# Patient Record
Sex: Female | Born: 1985 | Race: White | Hispanic: No | Smoking: Former smoker
Health system: Southern US, Community
[De-identification: ages and names within clinical notes are randomized; demographics above are authoritative.]

## PROBLEM LIST (undated history)

## (undated) DIAGNOSIS — S82871A Displaced pilon fracture of right tibia, initial encounter for closed fracture: Secondary | ICD-10-CM

## (undated) DIAGNOSIS — I10 Essential (primary) hypertension: Secondary | ICD-10-CM

## (undated) DIAGNOSIS — S82241A Displaced spiral fracture of shaft of right tibia, initial encounter for closed fracture: Secondary | ICD-10-CM

---

## 2016-10-17 ENCOUNTER — Emergency Department (HOSPITAL_COMMUNITY)
Admission: EM | Admit: 2016-10-17 | Discharge: 2016-10-17 | Disposition: A | Discharge status: Home / Self Care | Payer: Self-pay | Attending: Emergency Medicine | Admitting: Emergency Medicine

## 2016-10-17 ENCOUNTER — Encounter (HOSPITAL_COMMUNITY): Payer: Self-pay | Admitting: Emergency Medicine

## 2016-10-17 DIAGNOSIS — F1020 Alcohol dependence, uncomplicated: Secondary | ICD-10-CM | POA: Insufficient documentation

## 2016-10-17 DIAGNOSIS — F17200 Nicotine dependence, unspecified, uncomplicated: Secondary | ICD-10-CM | POA: Insufficient documentation

## 2016-10-17 DIAGNOSIS — R000 Tachycardia, unspecified: Secondary | ICD-10-CM | POA: Insufficient documentation

## 2016-10-17 DIAGNOSIS — F1410 Cocaine abuse, uncomplicated: Secondary | ICD-10-CM | POA: Insufficient documentation

## 2016-10-17 DIAGNOSIS — F419 Anxiety disorder, unspecified: Secondary | ICD-10-CM | POA: Insufficient documentation

## 2016-10-17 DIAGNOSIS — I10 Essential (primary) hypertension: Secondary | ICD-10-CM | POA: Insufficient documentation

## 2016-10-17 HISTORY — DX: Essential (primary) hypertension: I10

## 2016-10-17 LAB — CBC WITH DIFFERENTIAL/PLATELET
BASOS ABS: 0 10*3/uL (ref 0.0–0.1)
BASOS PCT: 0 %
EOS ABS: 0 10*3/uL (ref 0.0–0.7)
Eosinophils Relative: 0 %
HEMATOCRIT: 37.8 % (ref 36.0–46.0)
HEMOGLOBIN: 12.8 g/dL (ref 12.0–15.0)
Lymphocytes Relative: 16 %
Lymphs Abs: 1.7 10*3/uL (ref 0.7–4.0)
MCH: 30.8 pg (ref 26.0–34.0)
MCHC: 33.9 g/dL (ref 30.0–36.0)
MCV: 91.1 fL (ref 78.0–100.0)
MONOS PCT: 3 %
Monocytes Absolute: 0.3 10*3/uL (ref 0.1–1.0)
NEUTROS ABS: 8.6 10*3/uL — AB (ref 1.7–7.7)
NEUTROS PCT: 81 %
Platelets: 492 10*3/uL — ABNORMAL HIGH (ref 150–400)
RBC: 4.15 MIL/uL (ref 3.87–5.11)
RDW: 11.8 % (ref 11.5–15.5)
WBC: 10.7 10*3/uL — AB (ref 4.0–10.5)

## 2016-10-17 LAB — BASIC METABOLIC PANEL
ANION GAP: 12 (ref 5–15)
BUN: 13 mg/dL (ref 6–20)
CALCIUM: 8.4 mg/dL — AB (ref 8.9–10.3)
CO2: 23 mmol/L (ref 22–32)
CREATININE: 0.73 mg/dL (ref 0.44–1.00)
Chloride: 99 mmol/L — ABNORMAL LOW (ref 101–111)
Glucose, Bld: 148 mg/dL — ABNORMAL HIGH (ref 65–99)
Potassium: 3.2 mmol/L — ABNORMAL LOW (ref 3.5–5.1)
SODIUM: 134 mmol/L — AB (ref 135–145)

## 2016-10-17 LAB — TROPONIN I

## 2016-10-17 MED ORDER — ONDANSETRON HCL 4 MG/2ML IJ SOLN
4.0000 mg | Freq: Once | INTRAMUSCULAR | Status: AC
  Administered 2016-10-17: 4 mg via INTRAVENOUS
  Filled 2016-10-17: qty 2

## 2016-10-17 MED ORDER — SODIUM CHLORIDE 0.9 % IV BOLUS (SEPSIS)
1000.0000 mL | Freq: Once | INTRAVENOUS | Status: AC
  Administered 2016-10-17: 1000 mL via INTRAVENOUS

## 2016-10-17 NOTE — Discharge Instructions (Signed)
Avoid all forms of alcohol.  Do not use cocaine.  Follow-up with a treatment center, or therapist to help improve your outlook on life, and help to avoid using alcohol and cocaine

## 2016-10-17 NOTE — ED Triage Notes (Signed)
Pt reports palpitation, st drink"alot" , used cocaine last night. Denies chest pain . Also reports nausea and emesis./ HR 147 during triage.

## 2016-10-17 NOTE — ED Provider Notes (Addendum)
[facility]-EMERGENCY DEPT Provider Note   CSN: [id number] Arrival date & time: 10/17/16  0750     History   Chief Complaint Chief Complaint  Patient presents with  . Tachycardia    HPI Toni Andrews is a 30 y.o. female.  She presents for nausea, vomiting, depression". I feel like it hit rock bottom". She has been drinking frequently, and last night used a lot of cocaine. She woke this morning vomiting. She saw a small amount of "pink stuff". She denies diarrhea, fever, chest pain, cough, weakness or dizziness. She denies suicidal ideation. She is committed to stopping drinking. There are no other known modifying factors.  HPI  Past Medical History:  Diagnosis Date  . Hypertension     There are no active problems to display for this patient.   History reviewed. No pertinent surgical history.  OB History    No data available       Home Medications    Prior to Admission medications   Medication Sig Start Date End Date Taking? Authorizing Provider  ibuprofen (ADVIL,MOTRIN) 200 MG tablet Take 200-800 mg by mouth every 6 (six) hours as needed for moderate pain.   Yes Historical Provider, MD  omeprazole (PRILOSEC OTC) 20 MG tablet Take 20 mg by mouth daily.   Yes Historical Provider, MD    Family History No family history on file.  Social History Social History  Substance Use Topics  . Smoking status: Current Every Day Smoker  . Smokeless tobacco: Never Used  . Alcohol use Yes     Allergies   Patient has no known allergies.   Review of Systems Review of Systems  All other systems reviewed and are negative.    Physical Exam Updated Vital Signs BP 168/81   Pulse 109   Temp 99.6 F (37.6 C) (Oral)   Resp 18   SpO2 99%   Physical Exam  Constitutional: She is oriented to person, place, and time. She appears well-developed and well-nourished. She appears distressed (Tearful).  HENT:  Head: Normocephalic and atraumatic.  Eyes: Conjunctivae and EOM are  normal. Pupils are equal, round, and reactive to light.  Neck: Normal range of motion and phonation normal. Neck supple.  Cardiovascular: Regular rhythm.   Tachycardic  Pulmonary/Chest: Effort normal and breath sounds normal. She exhibits no tenderness.  Abdominal: Soft. She exhibits no distension. There is no tenderness. There is no guarding.  Musculoskeletal: Normal range of motion.  Neurological: She is alert and oriented to person, place, and time. She exhibits normal muscle tone.  Skin: Skin is warm and dry.  Psychiatric: Her behavior is normal. Judgment and thought content normal.  Depressed appearance  Nursing note and vitals reviewed.    ED Treatments / Results  Labs (all labs ordered are listed, but only abnormal results are displayed) Labs Reviewed  BASIC METABOLIC PANEL - Abnormal; Notable for the following:       Result Value   Sodium 134 (*)    Potassium 3.2 (*)    Chloride 99 (*)    Glucose, Bld 148 (*)    Calcium 8.4 (*)    All other components within normal limits  CBC WITH DIFFERENTIAL/PLATELET - Abnormal; Notable for the following:    WBC 10.7 (*)    Platelets 492 (*)    Neutro Abs 8.6 (*)    All other components within normal limits  TROPONIN I    EKG  EKG Interpretation  Date/Time:  Monday October 17 2016 08:08:52 EST Ventricular Rate:  135 PR Interval:    QRS Duration: 91 QT Interval:  303 QTC Calculation: 455 R Axis:   64 Text Interpretation:  Sinus tachycardia Probable left atrial enlargement RSR' in V1 or V2, probably normal variant Borderline T wave abnormalities No old tracing to compare Confirmed by [facility]  MD, Mehki Klumpp [phone]([id number]) on 10/17/2016 12:51:02 PM       Radiology No results found.  Procedures Procedures (including critical care time)  Medications Ordered in ED Medications  sodium chloride 0.9 % bolus 1,000 mL (0 mLs Intravenous Stopped 10/17/16 0915)  ondansetron (ZOFRAN) injection 4 mg (4 mg Intravenous Given 10/17/16 0817)       Initial Impression / Assessment and Plan / ED Course  I have reviewed the triage vital signs and the nursing notes.  Pertinent labs & imaging results that were available during my care of the patient were reviewed by me and considered in my medical decision making (see chart for details).  Clinical Course as of Oct 17 1336  Mon Oct 17, 2016  [id number] She is more comfortable now, tolerating some fluids. She has been talking with friends on the phone.  [EW]    Clinical Course User Index [EW] Mancel BaleElliott Manhattan Mccuen, MD    Medications  sodium chloride 0.9 % bolus 1,000 mL (0 mLs Intravenous Stopped 10/17/16 0915)  ondansetron (ZOFRAN) injection 4 mg (4 mg Intravenous Given 10/17/16 0817)    Patient Vitals for the past 24 hrs:  BP Temp Temp src Pulse Resp SpO2  10/17/16 1300 168/81 - - 109 18 99 %  10/17/16 1233 149/75 - - 94 20 97 %  10/17/16 1203 155/80 - - 92 24 99 %  10/17/16 1200 155/80 - - 109 20 97 %  10/17/16 1130 156/90 - - 93 - 99 %  10/17/16 1100 151/97 - - 93 - 99 %  10/17/16 1030 156/91 - - 106 (!) 27 99 %  10/17/16 1000 (!) 159/101 - - 108 25 98 %  10/17/16 0930 149/92 - - 120 21 97 %  10/17/16 0900 176/92 - - (!) 131 (!) 32 98 %  10/17/16 0845 153/91 - - 120 23 96 %  10/17/16 0830 154/90 - - 120 26 95 %  10/17/16 0815 165/91 - - (!) 136 23 94 %  10/17/16 0758 182/94 99.6 F (37.6 C) Oral (!) 145 20 95 %    1:02 PM Reevaluation with update and discussion. After initial assessment and treatment, an updated evaluation reveals She is calm now. Heart rate is 92. Patient asserts that she is committed to staying away from alcohol and cocaine. She feels like she might have had a "anxiety attack" earlier today. Elaya Droege L    Final Clinical Impressions(s) / ED Diagnoses   Final diagnoses:  Tachycardia  Cocaine abuse  Anxiety  Alcoholism (HCC)    Alcoholism, substance abuse, cocaine. Possible component of anxiety/panic attack. No acute unstable psychiatric condition.  Doubt metabolic instability.  Nursing Notes Reviewed/ Care Coordinated Applicable Imaging Reviewed Interpretation of Laboratory Data incorporated into ED treatment  The patient appears reasonably screened and/or stabilized for discharge and I doubt any other medical condition or other [facility] requiring further screening, evaluation, or treatment in the ED at this time prior to discharge.  Plan: Home Medications- continue; Home Treatments- rest, avoid alcohol and cocaine; return here if the recommended treatment, does not improve the symptoms; Recommended follow up- PCP prn. OP treatment prn    New Prescriptions New Prescriptions   No medications on file  Mancel Bale, MD 10/17/16 1303    Mancel Bale, MD 10/17/16 [phone]

## 2016-12-01 ENCOUNTER — Inpatient Hospital Stay (HOSPITAL_COMMUNITY)
Admission: EM | Admit: 2016-12-01 | Discharge: 2016-12-05 | DRG: 492 | Disposition: A | Discharge status: Rehabilitation Facility | Payer: Self-pay | Attending: Orthopedic Surgery | Admitting: Orthopedic Surgery

## 2016-12-01 ENCOUNTER — Emergency Department (HOSPITAL_COMMUNITY): Payer: Self-pay

## 2016-12-01 ENCOUNTER — Encounter (HOSPITAL_COMMUNITY): Payer: Self-pay

## 2016-12-01 DIAGNOSIS — S066X0A Traumatic subarachnoid hemorrhage without loss of consciousness, initial encounter: Secondary | ICD-10-CM

## 2016-12-01 DIAGNOSIS — S82201A Unspecified fracture of shaft of right tibia, initial encounter for closed fracture: Secondary | ICD-10-CM

## 2016-12-01 DIAGNOSIS — F32A Depression, unspecified: Secondary | ICD-10-CM

## 2016-12-01 DIAGNOSIS — I10 Essential (primary) hypertension: Secondary | ICD-10-CM | POA: Diagnosis present

## 2016-12-01 DIAGNOSIS — S82871A Displaced pilon fracture of right tibia, initial encounter for closed fracture: Secondary | ICD-10-CM | POA: Diagnosis present

## 2016-12-01 DIAGNOSIS — S82831A Other fracture of upper and lower end of right fibula, initial encounter for closed fracture: Secondary | ICD-10-CM

## 2016-12-01 DIAGNOSIS — S022XXA Fracture of nasal bones, initial encounter for closed fracture: Secondary | ICD-10-CM

## 2016-12-01 DIAGNOSIS — K219 Gastro-esophageal reflux disease without esophagitis: Secondary | ICD-10-CM | POA: Diagnosis present

## 2016-12-01 DIAGNOSIS — Z79899 Other long term (current) drug therapy: Secondary | ICD-10-CM

## 2016-12-01 DIAGNOSIS — F329 Major depressive disorder, single episode, unspecified: Secondary | ICD-10-CM | POA: Diagnosis present

## 2016-12-01 DIAGNOSIS — S82241A Displaced spiral fracture of shaft of right tibia, initial encounter for closed fracture: Principal | ICD-10-CM | POA: Diagnosis present

## 2016-12-01 DIAGNOSIS — F411 Generalized anxiety disorder: Secondary | ICD-10-CM

## 2016-12-01 DIAGNOSIS — I609 Nontraumatic subarachnoid hemorrhage, unspecified: Secondary | ICD-10-CM

## 2016-12-01 DIAGNOSIS — Z720 Tobacco use: Secondary | ICD-10-CM

## 2016-12-01 DIAGNOSIS — E46 Unspecified protein-calorie malnutrition: Secondary | ICD-10-CM | POA: Diagnosis present

## 2016-12-01 DIAGNOSIS — S8251XA Displaced fracture of medial malleolus of right tibia, initial encounter for closed fracture: Secondary | ICD-10-CM | POA: Diagnosis present

## 2016-12-01 DIAGNOSIS — F17200 Nicotine dependence, unspecified, uncomplicated: Secondary | ICD-10-CM | POA: Diagnosis present

## 2016-12-01 DIAGNOSIS — D62 Acute posthemorrhagic anemia: Secondary | ICD-10-CM

## 2016-12-01 DIAGNOSIS — F1020 Alcohol dependence, uncomplicated: Secondary | ICD-10-CM | POA: Diagnosis present

## 2016-12-01 DIAGNOSIS — R262 Difficulty in walking, not elsewhere classified: Secondary | ICD-10-CM

## 2016-12-01 DIAGNOSIS — S82401A Unspecified fracture of shaft of right fibula, initial encounter for closed fracture: Secondary | ICD-10-CM

## 2016-12-01 DIAGNOSIS — S82491A Other fracture of shaft of right fibula, initial encounter for closed fracture: Secondary | ICD-10-CM | POA: Diagnosis present

## 2016-12-01 DIAGNOSIS — S066X9A Traumatic subarachnoid hemorrhage with loss of consciousness of unspecified duration, initial encounter: Secondary | ICD-10-CM

## 2016-12-01 DIAGNOSIS — E8809 Other disorders of plasma-protein metabolism, not elsewhere classified: Secondary | ICD-10-CM

## 2016-12-01 DIAGNOSIS — E876 Hypokalemia: Secondary | ICD-10-CM

## 2016-12-01 DIAGNOSIS — F1410 Cocaine abuse, uncomplicated: Secondary | ICD-10-CM

## 2016-12-01 DIAGNOSIS — T148XXA Other injury of unspecified body region, initial encounter: Secondary | ICD-10-CM

## 2016-12-01 DIAGNOSIS — R000 Tachycardia, unspecified: Secondary | ICD-10-CM | POA: Diagnosis present

## 2016-12-01 HISTORY — DX: Displaced pilon fracture of right tibia, initial encounter for closed fracture: S82.871A

## 2016-12-01 HISTORY — DX: Displaced spiral fracture of shaft of right tibia, initial encounter for closed fracture: S82.241A

## 2016-12-01 LAB — RAPID URINE DRUG SCREEN, HOSP PERFORMED
Amphetamines: NOT DETECTED
BARBITURATES: NOT DETECTED
Benzodiazepines: NOT DETECTED
Cocaine: NOT DETECTED
Opiates: POSITIVE — AB
TETRAHYDROCANNABINOL: NOT DETECTED

## 2016-12-01 LAB — CBC WITH DIFFERENTIAL/PLATELET
Basophils Absolute: 0 10*3/uL (ref 0.0–0.1)
Basophils Relative: 0 %
EOS ABS: 0 10*3/uL (ref 0.0–0.7)
EOS PCT: 0 %
HCT: 36.2 % (ref 36.0–46.0)
HEMOGLOBIN: 12 g/dL (ref 12.0–15.0)
Lymphocytes Relative: 9 %
Lymphs Abs: 1.1 10*3/uL (ref 0.7–4.0)
MCH: 29.6 pg (ref 26.0–34.0)
MCHC: 33.1 g/dL (ref 30.0–36.0)
MCV: 89.4 fL (ref 78.0–100.0)
MONO ABS: 0.6 10*3/uL (ref 0.1–1.0)
MONOS PCT: 5 %
NEUTROS PCT: 86 %
Neutro Abs: 10.9 10*3/uL — ABNORMAL HIGH (ref 1.7–7.7)
Platelets: 388 10*3/uL (ref 150–400)
RBC: 4.05 MIL/uL (ref 3.87–5.11)
RDW: 12.5 % (ref 11.5–15.5)
WBC: 12.6 10*3/uL — ABNORMAL HIGH (ref 4.0–10.5)

## 2016-12-01 LAB — COMPREHENSIVE METABOLIC PANEL
ALK PHOS: 55 U/L (ref 38–126)
ALT: 23 U/L (ref 14–54)
ANION GAP: 9 (ref 5–15)
AST: 29 U/L (ref 15–41)
Albumin: 4.3 g/dL (ref 3.5–5.0)
BUN: 15 mg/dL (ref 6–20)
CALCIUM: 8.2 mg/dL — AB (ref 8.9–10.3)
CO2: 23 mmol/L (ref 22–32)
Chloride: 108 mmol/L (ref 101–111)
Creatinine, Ser: 0.61 mg/dL (ref 0.44–1.00)
GFR calc non Af Amer: >60 mL/min (ref >60)
Glucose, Bld: 120 mg/dL — ABNORMAL HIGH (ref 65–99)
Potassium: 3.8 mmol/L (ref 3.5–5.1)
SODIUM: 140 mmol/L (ref 135–145)
TOTAL PROTEIN: 6.7 g/dL (ref 6.5–8.1)
Total Bilirubin: 0.5 mg/dL (ref 0.3–1.2)

## 2016-12-01 LAB — URINALYSIS, ROUTINE W REFLEX MICROSCOPIC
BILIRUBIN URINE: NEGATIVE
GLUCOSE, UA: NEGATIVE mg/dL
Ketones, ur: 20 mg/dL — AB
LEUKOCYTES UA: NEGATIVE
NITRITE: NEGATIVE
PH: 5 (ref 5.0–8.0)
Protein, ur: NEGATIVE mg/dL
SPECIFIC GRAVITY, URINE: 1.021 (ref 1.005–1.030)

## 2016-12-01 LAB — LACTIC ACID, PLASMA: LACTIC ACID, VENOUS: 2.4 mmol/L — AB (ref 0.5–1.9)

## 2016-12-01 LAB — I-STAT BETA HCG BLOOD, ED (MC, WL, AP ONLY): I-stat hCG, quantitative: <5 m[IU]/mL (ref <5)

## 2016-12-01 MED ORDER — ONDANSETRON 4 MG PO TBDP
4.0000 mg | ORAL_TABLET | Freq: Four times a day (QID) | ORAL | Status: DC | PRN
  Filled 2016-12-01: qty 1

## 2016-12-01 MED ORDER — FAMOTIDINE IN NACL 20-0.9 MG/50ML-% IV SOLN
20.0000 mg | Freq: Two times a day (BID) | INTRAVENOUS | Status: DC
  Administered 2016-12-01 – 2016-12-02 (×4): 20 mg via INTRAVENOUS
  Filled 2016-12-01 (×5): qty 50

## 2016-12-01 MED ORDER — SODIUM CHLORIDE 0.9 % IV BOLUS (SEPSIS)
1000.0000 mL | Freq: Once | INTRAVENOUS | Status: AC
  Administered 2016-12-01: 1000 mL via INTRAVENOUS

## 2016-12-01 MED ORDER — KCL IN DEXTROSE-NACL 20-5-0.9 MEQ/L-%-% IV SOLN
INTRAVENOUS | Status: DC
  Administered 2016-12-01 – 2016-12-02 (×3): via INTRAVENOUS
  Filled 2016-12-01 (×5): qty 1000

## 2016-12-01 MED ORDER — WHITE PETROLATUM GEL
Status: AC
  Administered 2016-12-01: 15:00:00
  Filled 2016-12-01: qty 1

## 2016-12-01 MED ORDER — ONDANSETRON HCL 4 MG/2ML IJ SOLN
4.0000 mg | Freq: Once | INTRAMUSCULAR | Status: AC
  Administered 2016-12-01: 4 mg via INTRAVENOUS
  Filled 2016-12-01: qty 2

## 2016-12-01 MED ORDER — ONDANSETRON HCL 4 MG/2ML IJ SOLN
4.0000 mg | Freq: Four times a day (QID) | INTRAMUSCULAR | Status: DC | PRN
  Administered 2016-12-02 – 2016-12-04 (×2): 4 mg via INTRAVENOUS
  Filled 2016-12-01 (×3): qty 2

## 2016-12-01 MED ORDER — DIPHENHYDRAMINE HCL 50 MG/ML IJ SOLN
25.0000 mg | Freq: Four times a day (QID) | INTRAMUSCULAR | Status: DC | PRN
  Administered 2016-12-01: 25 mg via INTRAVENOUS
  Filled 2016-12-01: qty 1

## 2016-12-01 MED ORDER — HYDROMORPHONE HCL 1 MG/ML IJ SOLN
1.0000 mg | INTRAMUSCULAR | Status: DC | PRN
  Administered 2016-12-01 – 2016-12-02 (×5): 1 mg via INTRAVENOUS
  Filled 2016-12-01 (×5): qty 1

## 2016-12-01 MED ORDER — MORPHINE SULFATE (PF) 2 MG/ML IV SOLN
1.0000 mg | INTRAVENOUS | Status: DC | PRN
  Administered 2016-12-01: 3 mg via INTRAVENOUS
  Administered 2016-12-01: 2 mg via INTRAVENOUS
  Administered 2016-12-01 (×2): 3 mg via INTRAVENOUS
  Administered 2016-12-02 (×3): 2 mg via INTRAVENOUS
  Filled 2016-12-01: qty 1
  Filled 2016-12-01: qty 2
  Filled 2016-12-01 (×2): qty 1
  Filled 2016-12-01: qty 2
  Filled 2016-12-01: qty 1
  Filled 2016-12-01 (×2): qty 2

## 2016-12-01 MED ORDER — DIPHENHYDRAMINE HCL 25 MG PO CAPS: 25.0000 mg | ORAL_CAPSULE | Freq: Four times a day (QID) | ORAL | Status: DC | PRN

## 2016-12-01 MED ORDER — MORPHINE SULFATE (PF) 4 MG/ML IV SOLN
4.0000 mg | Freq: Once | INTRAVENOUS | Status: AC
  Administered 2016-12-01: 4 mg via INTRAVENOUS
  Filled 2016-12-01: qty 1

## 2016-12-01 MED ORDER — CEFAZOLIN SODIUM-DEXTROSE 2-4 GM/100ML-% IV SOLN
2.0000 g | Freq: Once | INTRAVENOUS | Status: AC
  Administered 2016-12-02: 2 g via INTRAVENOUS
  Filled 2016-12-01: qty 100

## 2016-12-01 MED ORDER — HYDROMORPHONE HCL 1 MG/ML IJ SOLN
1.0000 mg | Freq: Once | INTRAMUSCULAR | Status: AC
  Administered 2016-12-01: 1 mg via INTRAVENOUS
  Filled 2016-12-01: qty 1
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
## 2016-12-01 NOTE — Progress Notes (Signed)
Patient ID: Toni Andrews, female   DOB: 1986-01-13, 31 y.o.   MRN: [id number] I spoke with her and her mother. She is having RLE pain - ortho tech loosened splint a bit - good cap refill. Add dilaudid PRN. Sips/chips, NPO at MN. CT head/RLE in AM  Violeta GelinasBurke Itxel Wickard, MD, MPH, FACS Trauma: [phone] General Surgery: [phone]
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
## 2016-12-01 NOTE — Progress Notes (Signed)
Orthopedic Tech Progress Note Patient Details:  Toni Andrews Apr 01, 1986 [id number] rewrapped long leg splint Patient ID: Toni Andrews, female   DOB: Apr 01, 1986, 31 y.o.   MRN: [id number]   Toni Andrews, Toni Andrews Craig 12/01/2016, 5:16 PM
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
## 2016-12-01 NOTE — Progress Notes (Addendum)
Patient now reports that the RLE splint is too tight and states that it is "cutting off my circulation." She is able to wiggle her toes and move RLE laterally. She can feel touch to the toes, capillary refill 3 seconds, toes are cool, unable to palpate distal pulse d/t splint location. RLE elevated. Ice was previously applied @ 15:00 for pain. MD aware of new reports, ortho tech notified and arrived to bedside and adjusted splint. Will continue to monitor. Update: pt reports improvement after adjustment, dp pulse 2+, and capillary refill improved as well.
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
## 2016-12-02 ENCOUNTER — Inpatient Hospital Stay (HOSPITAL_COMMUNITY): Payer: Self-pay

## 2016-12-02 ENCOUNTER — Inpatient Hospital Stay (HOSPITAL_COMMUNITY): Payer: Self-pay | Admitting: Certified Registered Nurse Anesthetist

## 2016-12-02 ENCOUNTER — Encounter (HOSPITAL_COMMUNITY): Admission: EM | Disposition: A | Discharge status: Rehabilitation Facility | Payer: Self-pay | Source: Home / Self Care

## 2016-12-02 ENCOUNTER — Encounter (HOSPITAL_COMMUNITY): Payer: Self-pay | Admitting: Certified Registered Nurse Anesthetist

## 2016-12-02 HISTORY — PX: TIBIA IM NAIL INSERTION: SHX2516

## 2016-12-02 HISTORY — PX: ORIF ANKLE FRACTURE: SHX5408

## 2016-12-02 LAB — APTT: aPTT: 28 seconds (ref 24–36)

## 2016-12-02 LAB — TYPE AND SCREEN
ABO/RH(D): B POS
ANTIBODY SCREEN: NEGATIVE

## 2016-12-02 LAB — CBC
HCT: 31.4 % — ABNORMAL LOW (ref 36.0–46.0)
Hemoglobin: 10.3 g/dL — ABNORMAL LOW (ref 12.0–15.0)
MCH: 30.3 pg (ref 26.0–34.0)
MCHC: 32.8 g/dL (ref 30.0–36.0)
MCV: 92.4 fL (ref 78.0–100.0)
PLATELETS: 272 10*3/uL (ref 150–400)
RBC: 3.4 MIL/uL — ABNORMAL LOW (ref 3.87–5.11)
RDW: 12.4 % (ref 11.5–15.5)
WBC: 5.5 10*3/uL (ref 4.0–10.5)

## 2016-12-02 LAB — COMPREHENSIVE METABOLIC PANEL
ALBUMIN: 3.2 g/dL — AB (ref 3.5–5.0)
ALK PHOS: 48 U/L (ref 38–126)
ALT: 21 U/L (ref 14–54)
ANION GAP: 7 (ref 5–15)
AST: 24 U/L (ref 15–41)
BILIRUBIN TOTAL: 0.8 mg/dL (ref 0.3–1.2)
BUN: 7 mg/dL (ref 6–20)
CALCIUM: 7.5 mg/dL — AB (ref 8.9–10.3)
CO2: 25 mmol/L (ref 22–32)
Chloride: 105 mmol/L (ref 101–111)
Creatinine, Ser: 0.64 mg/dL (ref 0.44–1.00)
GFR calc Af Amer: >60 mL/min (ref >60)
GLUCOSE: 110 mg/dL — AB (ref 65–99)
POTASSIUM: 3.4 mmol/L — AB (ref 3.5–5.1)
Sodium: 137 mmol/L (ref 135–145)
TOTAL PROTEIN: 5.8 g/dL — AB (ref 6.5–8.1)

## 2016-12-02 LAB — PROTIME-INR
INR: 1.06
Prothrombin Time: 13.8 seconds (ref 11.4–15.2)

## 2016-12-02 LAB — ABO/RH: ABO/RH(D): B POS

## 2016-12-02 LAB — MAGNESIUM: MAGNESIUM: 1.6 mg/dL — AB (ref 1.7–2.4)

## 2016-12-02 SURGERY — OPEN REDUCTION INTERNAL FIXATION (ORIF) ANKLE FRACTURE
Anesthesia: General | Site: Leg Lower | Laterality: Right

## 2016-12-02 MED ORDER — FENTANYL CITRATE (PF) 100 MCG/2ML IJ SOLN
INTRAMUSCULAR | Status: AC
  Filled 2016-12-02: qty 4

## 2016-12-02 MED ORDER — LORAZEPAM 2 MG/ML IJ SOLN: 1.0000 mg | Freq: Four times a day (QID) | INTRAMUSCULAR | Status: AC | PRN

## 2016-12-02 MED ORDER — HYDROMORPHONE HCL 1 MG/ML IJ SOLN
INTRAMUSCULAR | Status: AC
  Filled 2016-12-02: qty 1

## 2016-12-02 MED ORDER — FENTANYL CITRATE (PF) 100 MCG/2ML IJ SOLN
INTRAMUSCULAR | Status: AC
  Filled 2016-12-02: qty 2

## 2016-12-02 MED ORDER — PROMETHAZINE HCL 25 MG/ML IJ SOLN: 6.2500 mg | INTRAMUSCULAR | Status: DC | PRN

## 2016-12-02 MED ORDER — LORAZEPAM 1 MG PO TABS
1.0000 mg | ORAL_TABLET | Freq: Four times a day (QID) | ORAL | Status: AC | PRN
  Administered 2016-12-04: 1 mg via ORAL
  Filled 2016-12-02: qty 1

## 2016-12-02 MED ORDER — OXYCODONE HCL 5 MG PO TABS
5.0000 mg | ORAL_TABLET | ORAL | Status: DC | PRN
  Administered 2016-12-02 – 2016-12-03 (×5): 15 mg via ORAL
  Filled 2016-12-02 (×5): qty 3

## 2016-12-02 MED ORDER — VITAMIN B-1 100 MG PO TABS
100.0000 mg | ORAL_TABLET | Freq: Every day | ORAL | Status: DC
  Administered 2016-12-03 – 2016-12-05 (×3): 100 mg via ORAL
  Filled 2016-12-02 (×4): qty 1

## 2016-12-02 MED ORDER — HYDROMORPHONE HCL 2 MG/ML IJ SOLN
0.5000 mg | INTRAMUSCULAR | Status: DC | PRN
  Administered 2016-12-02 – 2016-12-03 (×4): 0.5 mg via INTRAVENOUS
  Filled 2016-12-02 (×4): qty 1

## 2016-12-02 MED ORDER — NEOSTIGMINE METHYLSULFATE 5 MG/5ML IV SOSY
PREFILLED_SYRINGE | INTRAVENOUS | Status: AC
  Filled 2016-12-02: qty 5

## 2016-12-02 MED ORDER — MIDAZOLAM HCL 2 MG/2ML IJ SOLN
INTRAMUSCULAR | Status: AC
  Filled 2016-12-02: qty 2

## 2016-12-02 MED ORDER — ARTIFICIAL TEARS OP OINT
TOPICAL_OINTMENT | OPHTHALMIC | Status: DC | PRN
  Administered 2016-12-02: 1 via OPHTHALMIC

## 2016-12-02 MED ORDER — MIDAZOLAM HCL 5 MG/5ML IJ SOLN
INTRAMUSCULAR | Status: DC | PRN
  Administered 2016-12-02: 2 mg via INTRAVENOUS

## 2016-12-02 MED ORDER — HYDROMORPHONE HCL 1 MG/ML IJ SOLN: 0.5000 mg | INTRAMUSCULAR | Status: DC | PRN

## 2016-12-02 MED ORDER — ONDANSETRON HCL 4 MG/2ML IJ SOLN
INTRAMUSCULAR | Status: AC
  Filled 2016-12-02: qty 2

## 2016-12-02 MED ORDER — CEFAZOLIN SODIUM-DEXTROSE 2-4 GM/100ML-% IV SOLN
2.0000 g | Freq: Three times a day (TID) | INTRAVENOUS | Status: AC
  Administered 2016-12-02 – 2016-12-03 (×3): 2 g via INTRAVENOUS
  Filled 2016-12-02 (×3): qty 100

## 2016-12-02 MED ORDER — ROCURONIUM BROMIDE 100 MG/10ML IV SOLN
INTRAVENOUS | Status: DC | PRN
  Administered 2016-12-02: 50 mg via INTRAVENOUS

## 2016-12-02 MED ORDER — 0.9 % SODIUM CHLORIDE (POUR BTL) OPTIME
TOPICAL | Status: DC | PRN
  Administered 2016-12-02: 1000 mL

## 2016-12-02 MED ORDER — CEFAZOLIN SODIUM 1 G IJ SOLR
INTRAMUSCULAR | Status: AC
  Filled 2016-12-02: qty 20

## 2016-12-02 MED ORDER — ONDANSETRON HCL 4 MG/2ML IJ SOLN
INTRAMUSCULAR | Status: DC | PRN
  Administered 2016-12-02: 4 mg via INTRAVENOUS

## 2016-12-02 MED ORDER — LIDOCAINE 2% (20 MG/ML) 5 ML SYRINGE
INTRAMUSCULAR | Status: AC
  Filled 2016-12-02: qty 5

## 2016-12-02 MED ORDER — LIDOCAINE 2% (20 MG/ML) 5 ML SYRINGE
INTRAMUSCULAR | Status: DC | PRN
  Administered 2016-12-02 (×2): 20 mg via INTRAVENOUS

## 2016-12-02 MED ORDER — PROPOFOL 10 MG/ML IV BOLUS
INTRAVENOUS | Status: AC
  Filled 2016-12-02: qty 40

## 2016-12-02 MED ORDER — THIAMINE HCL 100 MG/ML IJ SOLN
100.0000 mg | Freq: Every day | INTRAMUSCULAR | Status: DC
  Administered 2016-12-03: 100 mg via INTRAVENOUS
  Filled 2016-12-02: qty 2

## 2016-12-02 MED ORDER — PROPOFOL 10 MG/ML IV BOLUS
INTRAVENOUS | Status: DC | PRN
  Administered 2016-12-02: 10 mg via INTRAVENOUS
  Administered 2016-12-02: 150 mg via INTRAVENOUS
  Administered 2016-12-02: 10 mg via INTRAVENOUS
  Administered 2016-12-02: 20 mg via INTRAVENOUS

## 2016-12-02 MED ORDER — DEXAMETHASONE SODIUM PHOSPHATE 10 MG/ML IJ SOLN
INTRAMUSCULAR | Status: DC | PRN
  Administered 2016-12-02: 10 mg via INTRAVENOUS

## 2016-12-02 MED ORDER — HYDROMORPHONE HCL 1 MG/ML IJ SOLN
0.2500 mg | INTRAMUSCULAR | Status: DC | PRN
  Administered 2016-12-02 (×4): 0.5 mg via INTRAVENOUS

## 2016-12-02 MED ORDER — LORAZEPAM 1 MG PO TABS
0.0000 mg | ORAL_TABLET | Freq: Two times a day (BID) | ORAL | Status: DC
Start: 2016-12-04 — End: 2016-12-05
  Administered 2016-12-04 – 2016-12-05 (×2): 2 mg via ORAL
  Filled 2016-12-02 (×2): qty 2

## 2016-12-02 MED ORDER — HYDROMORPHONE HCL 1 MG/ML IJ SOLN
INTRAMUSCULAR | Status: DC | PRN
  Administered 2016-12-02: 1 mg via INTRAVENOUS

## 2016-12-02 MED ORDER — GLYCOPYRROLATE 0.2 MG/ML IJ SOLN
INTRAMUSCULAR | Status: DC | PRN
  Administered 2016-12-02: 0.4 mg via INTRAVENOUS

## 2016-12-02 MED ORDER — MEPERIDINE HCL 25 MG/ML IJ SOLN: 6.2500 mg | INTRAMUSCULAR | Status: DC | PRN

## 2016-12-02 MED ORDER — FOLIC ACID 1 MG PO TABS
1.0000 mg | ORAL_TABLET | Freq: Every day | ORAL | Status: DC
  Administered 2016-12-03 – 2016-12-05 (×3): 1 mg via ORAL
  Filled 2016-12-02 (×4): qty 1

## 2016-12-02 MED ORDER — CEFAZOLIN SODIUM 1 G IJ SOLR
INTRAMUSCULAR | Status: DC | PRN
  Administered 2016-12-02: 2 g via INTRAMUSCULAR

## 2016-12-02 MED ORDER — ADULT MULTIVITAMIN W/MINERALS CH
1.0000 | ORAL_TABLET | Freq: Every day | ORAL | Status: DC
  Administered 2016-12-03 – 2016-12-05 (×3): 1 via ORAL
  Filled 2016-12-02 (×4): qty 1

## 2016-12-02 MED ORDER — ROCURONIUM BROMIDE 50 MG/5ML IV SOSY
PREFILLED_SYRINGE | INTRAVENOUS | Status: AC
  Filled 2016-12-02: qty 5

## 2016-12-02 MED ORDER — MIDAZOLAM HCL 2 MG/2ML IJ SOLN: 0.5000 mg | Freq: Once | INTRAMUSCULAR | Status: DC | PRN

## 2016-12-02 MED ORDER — FENTANYL CITRATE (PF) 100 MCG/2ML IJ SOLN
INTRAMUSCULAR | Status: DC | PRN
  Administered 2016-12-02: 100 ug via INTRAVENOUS
  Administered 2016-12-02: 50 ug via INTRAVENOUS
  Administered 2016-12-02 (×2): 100 ug via INTRAVENOUS
  Administered 2016-12-02: 50 ug via INTRAVENOUS

## 2016-12-02 MED ORDER — DEXAMETHASONE SODIUM PHOSPHATE 10 MG/ML IJ SOLN
INTRAMUSCULAR | Status: AC
  Filled 2016-12-02: qty 1

## 2016-12-02 MED ORDER — LACTATED RINGERS IV SOLN
INTRAVENOUS | Status: DC | PRN
  Administered 2016-12-02 (×2): via INTRAVENOUS

## 2016-12-02 MED ORDER — LORAZEPAM 1 MG PO TABS
0.0000 mg | ORAL_TABLET | Freq: Four times a day (QID) | ORAL | Status: AC
  Administered 2016-12-02: 1 mg via ORAL
  Administered 2016-12-03 (×2): 4 mg via ORAL
  Administered 2016-12-03 (×2): 1 mg via ORAL
  Filled 2016-12-02: qty 4
  Filled 2016-12-02 (×2): qty 1
  Filled 2016-12-02: qty 4
  Filled 2016-12-02: qty 1

## 2016-12-02 MED ORDER — ARTIFICIAL TEARS OP OINT
TOPICAL_OINTMENT | OPHTHALMIC | Status: AC
  Filled 2016-12-02: qty 3.5

## 2016-12-02 MED ORDER — HYDROMORPHONE HCL 1 MG/ML IJ SOLN
0.5000 mg | INTRAMUSCULAR | Status: DC | PRN
  Administered 2016-12-02: 1 mg via INTRAVENOUS
  Filled 2016-12-02: qty 1

## 2016-12-02 MED ORDER — NEOSTIGMINE METHYLSULFATE 10 MG/10ML IV SOLN
INTRAVENOUS | Status: DC | PRN
  Administered 2016-12-02: 3 mg via INTRAVENOUS

## 2016-12-02 SURGICAL SUPPLY — 82 items
BANDAGE ACE 4X5 VEL STRL LF (GAUZE/BANDAGES/DRESSINGS) ×4 IMPLANT
BANDAGE ACE 6X5 VEL STRL LF (GAUZE/BANDAGES/DRESSINGS) ×8 IMPLANT
BIT DRILL 2.9 CANN QC NONSTRL (BIT) ×4 IMPLANT
BIT DRILL 3.8X6 NS (BIT) ×8 IMPLANT
BIT DRILL 4.4 NS (BIT) ×4 IMPLANT
BLADE SURG 10 STRL SS (BLADE) ×8 IMPLANT
BNDG GAUZE ELAST 4 BULKY (GAUZE/BANDAGES/DRESSINGS) ×8 IMPLANT
BRUSH SCRUB DISP (MISCELLANEOUS) ×8 IMPLANT
CLOSURE WOUND 1/2 X4 (GAUZE/BANDAGES/DRESSINGS) ×1
COVER MAYO STAND STRL (DRAPES) ×4 IMPLANT
COVER SURGICAL LIGHT HANDLE (MISCELLANEOUS) ×8 IMPLANT
DRAPE C-ARM 42X72 X-RAY (DRAPES) ×4 IMPLANT
DRAPE C-ARMOR (DRAPES) ×8 IMPLANT
DRAPE HALF SHEET 40X57 (DRAPES) ×8 IMPLANT
DRAPE INCISE IOBAN 66X45 STRL (DRAPES) IMPLANT
DRAPE PROXIMA HALF (DRAPES) ×8 IMPLANT
DRAPE U-SHAPE 47X51 STRL (DRAPES) ×4 IMPLANT
DRESSING ADAPTIC 1/2  N-ADH (PACKING) ×4 IMPLANT
DRSG ADAPTIC 3X8 NADH LF (GAUZE/BANDAGES/DRESSINGS) ×4 IMPLANT
DRSG EMULSION OIL 3X3 NADH (GAUZE/BANDAGES/DRESSINGS) ×4 IMPLANT
ELECT REM PT RETURN 9FT ADLT (ELECTROSURGICAL) ×4
ELECTRODE REM PT RTRN 9FT ADLT (ELECTROSURGICAL) ×2 IMPLANT
GAUZE SPONGE 4X4 12PLY STRL (GAUZE/BANDAGES/DRESSINGS) ×4 IMPLANT
GLOVE BIO SURGEON STRL SZ7.5 (GLOVE) ×4 IMPLANT
GLOVE BIO SURGEON STRL SZ8 (GLOVE) ×4 IMPLANT
GLOVE BIO SURGEON STRL SZ8.5 (GLOVE) ×4 IMPLANT
GLOVE BIOGEL PI IND STRL 6.5 (GLOVE) ×4 IMPLANT
GLOVE BIOGEL PI IND STRL 7.5 (GLOVE) ×2 IMPLANT
GLOVE BIOGEL PI IND STRL 8 (GLOVE) ×2 IMPLANT
GLOVE BIOGEL PI INDICATOR 6.5 (GLOVE) ×4
GLOVE BIOGEL PI INDICATOR 7.5 (GLOVE) ×2
GLOVE BIOGEL PI INDICATOR 8 (GLOVE) ×2
GOWN STRL REUS W/ TWL LRG LVL3 (GOWN DISPOSABLE) ×4 IMPLANT
GOWN STRL REUS W/ TWL XL LVL3 (GOWN DISPOSABLE) ×2 IMPLANT
GOWN STRL REUS W/TWL LRG LVL3 (GOWN DISPOSABLE) ×4
GOWN STRL REUS W/TWL XL LVL3 (GOWN DISPOSABLE) ×2
GUIDEWIRE BALL NOSE 80CM (WIRE) ×4 IMPLANT
K-WIRE ACE 1.6X6 (WIRE) ×8
KIT BASIN OR (CUSTOM PROCEDURE TRAY) ×4 IMPLANT
KIT ROOM TURNOVER OR (KITS) ×4 IMPLANT
KWIRE ACE 1.6X6 (WIRE) ×4 IMPLANT
MANIFOLD NEPTUNE II (INSTRUMENTS) ×4 IMPLANT
NAIL TIBIAL 8MMX31.5CM (Nail) ×4 IMPLANT
NEEDLE HYPO 21X1.5 SAFETY (NEEDLE) IMPLANT
NS IRRIG 1000ML POUR BTL (IV SOLUTION) ×4 IMPLANT
PACK ORTHO EXTREMITY (CUSTOM PROCEDURE TRAY) ×4 IMPLANT
PAD ABD 8X10 STRL (GAUZE/BANDAGES/DRESSINGS) ×4 IMPLANT
PAD ARMBOARD 7.5X6 YLW CONV (MISCELLANEOUS) ×8 IMPLANT
PAD CAST 4YDX4 CTTN HI CHSV (CAST SUPPLIES) ×2 IMPLANT
PADDING CAST ABS 6INX4YD NS (CAST SUPPLIES) ×2
PADDING CAST ABS COTTON 6X4 NS (CAST SUPPLIES) ×2 IMPLANT
PADDING CAST COTTON 4X4 STRL (CAST SUPPLIES) ×2
PADDING CAST COTTON 6X4 STRL (CAST SUPPLIES) ×4 IMPLANT
SCREW ACE CAN 4.0 36M (Screw) ×4 IMPLANT
SCREW ACE CAN 4.0 40M (Screw) ×4 IMPLANT
SCREW ACE CAN 4.0 42M (Screw) ×4 IMPLANT
SCREW ACE CAN 4.0 48M (Screw) ×4 IMPLANT
SCREW ACECAP 34MM (Screw) ×4 IMPLANT
SCREW ACECAP 42MM (Screw) ×4 IMPLANT
SCREW PROXIMAL DEPUY (Screw) ×4 IMPLANT
SCREW PRXML FT 45X5.5XLCK NS (Screw) ×2 IMPLANT
SCREW PRXML FT 55X5.5XNS TIB (Screw) ×2 IMPLANT
SPONGE GAUZE 4X4 12PLY STER LF (GAUZE/BANDAGES/DRESSINGS) ×8 IMPLANT
SPONGE LAP 18X18 X RAY DECT (DISPOSABLE) ×4 IMPLANT
SPONGE SCRUB IODOPHOR (GAUZE/BANDAGES/DRESSINGS) ×4 IMPLANT
STAPLER VISISTAT 35W (STAPLE) ×4 IMPLANT
STRIP CLOSURE SKIN 1/2X4 (GAUZE/BANDAGES/DRESSINGS) ×3 IMPLANT
SUCTION FRAZIER HANDLE 10FR (MISCELLANEOUS) ×2
SUCTION TUBE FRAZIER 10FR DISP (MISCELLANEOUS) ×2 IMPLANT
SUT ETHILON 3 0 PS 1 (SUTURE) ×8 IMPLANT
SUT PDS AB 2-0 CT1 27 (SUTURE) IMPLANT
SUT VIC AB 0 CT1 27 (SUTURE) ×2
SUT VIC AB 0 CT1 27XBRD ANBCTR (SUTURE) ×2 IMPLANT
SUT VIC AB 2-0 CT1 27 (SUTURE) ×4
SUT VIC AB 2-0 CT1 TAPERPNT 27 (SUTURE) ×4 IMPLANT
TOWEL OR 17X24 6PK STRL BLUE (TOWEL DISPOSABLE) ×4 IMPLANT
TOWEL OR 17X26 10 PK STRL BLUE (TOWEL DISPOSABLE) ×8 IMPLANT
TUBE CONNECTING 12'X1/4 (SUCTIONS) ×1
TUBE CONNECTING 12X1/4 (SUCTIONS) ×3 IMPLANT
UNDERPAD 30X30 (UNDERPADS AND DIAPERS) ×4 IMPLANT
WATER STERILE IRR 1000ML POUR (IV SOLUTION) ×4 IMPLANT
YANKAUER SUCT BULB TIP NO VENT (SUCTIONS) ×4 IMPLANT

## 2016-12-02 NOTE — Transfer of Care (Signed)
Immediate Anesthesia Transfer of Care Note  Patient: Toni Andrews  Procedure(s) Performed: Procedure(s): OPEN REDUCTION INTERNAL FIXATION (ORIF) ANKLE FRACTURE (Right) INTRAMEDULLARY (IM) NAIL TIBIAL (Right)  Patient Location: PACU  Anesthesia Type:General  Level of Consciousness: awake  Airway & Oxygen Therapy: Patient Spontanous Breathing  Post-op Assessment: Report given to RN and Post -op Vital signs reviewed and stable  Post vital signs: Reviewed and stable  Last Vitals:  Vitals:   12/02/16 1000 12/02/16 1100  BP: (!) 142/86 137/77  Pulse: 71 97  Resp: 16 18  Temp:      Last Pain:  Vitals:   12/02/16 0800  TempSrc: Oral  PainSc:       Patients Stated Pain Goal: 2 (12/01/16 2115)  Complications: No apparent anesthesia complications
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
## 2016-12-02 NOTE — Brief Op Note (Signed)
12/01/2016 - 12/02/2016  2:48 PM  PATIENT:  Toni Andrews  31 y.o. female  PRE-OPERATIVE DIAGNOSIS:   1. RIGHT TIBIA AND FIBULA FRACTURES 2. RIGHT TIBIAL PLAFOND FRACTURE   POST-OPERATIVE DIAGNOSIS:   1. RIGHT TIBIA AND FIBULA FRACTURES 2. RIGHT TIBIAL PLAFOND FRACTURE   PROCEDURE:  Procedure(s): 1. OPEN REDUCTION INTERNAL FIXATION (ORIF) ANKLE FRACTURE (Right) 2. INTRAMEDULLARY (IM) NAIL TIBIAL (Right)  SURGEON:  Surgeon(s) and Role:    * Myrene GalasMichael Leanne Sisler, MD - Primary  ASSISTANTS: none   ANESTHESIA:   general  EBL:  Total I/O In: 1650 [I.V.:1650] Out: 700 [Urine:650; Blood:50]  BLOOD ADMINISTERED:none  DRAINS: none   LOCAL MEDICATIONS USED:  NONE  SPECIMEN:  No Specimen  DISPOSITION OF SPECIMEN:  N/A  COUNTS:  YES  TOURNIQUET:  * No tourniquets in log *  DICTATION: .Other Dictation: Dictation Number [id number]  PLAN OF CARE: Admit to inpatient   PATIENT DISPOSITION:  PACU - hemodynamically stable.   Delay start of Pharmacological VTE agent (>24hrs) due to surgical blood loss or risk of bleeding: no

## 2016-12-02 NOTE — Plan of Care (Signed)
Problem: Safety: Goal: Ability to remain free from injury will improve Outcome: Progressing Safety precautions maintained, no fall or injuries, mother at bedside  Problem: Pain Managment: Goal: General experience of comfort will improve Outcome: Progressing Pain is not very well controlled, medicated twice for pain this shift with moderate relief  Problem: Physical Regulation: Goal: Will remain free from infection Outcome: Progressing No S/S of infection noted, VSS  Problem: Skin Integrity: Goal: Risk for impaired skin integrity will decrease Outcome: Not Progressing Multiple bruises and abrasions to skin  Problem: Tissue Perfusion: Goal: Risk factors for ineffective tissue perfusion will decrease Outcome: Progressing No S/S of DVT noted, SCDs are on  Problem: Activity: Goal: Risk for activity intolerance will decrease Outcome: Progressing Tolerate bed mobility well  Problem: Bowel/Gastric: Goal: Will not experience complications related to bowel motility Outcome: Progressing No bowel or gastric issues noted

## 2016-12-02 NOTE — Progress Notes (Signed)
Pt left for the OR at 1100.  After surgery, pt will go to [facility].  Jennye MoccasinMom, Julie Augustine [phone], was notified.  Report was called to [facility] RN, Florentina AddisonKatie.
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
## 2016-12-02 NOTE — Progress Notes (Signed)
Orthopedic Tech Progress Note Patient Details:  Toni Andrews 07/31/1986 [id number]  Ortho Devices Type of Ortho Device: Other (comment) Splint Material: Plaster Ortho Device/Splint Location: Provided Prafo Boot as requested by nurse. (ortho Tech) Ortho Device/Splint Interventions: Application   Alvina ChouWilliams, Imogine Carvell C 12/02/2016, 2:40 PM
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
## 2016-12-02 NOTE — Anesthesia Procedure Notes (Signed)
Procedure Name: Intubation Date/Time: 12/02/2016 12:34 PM Performed by: Sampson Si E Pre-anesthesia Checklist: Patient identified, Emergency Drugs available, Suction available and Patient being monitored Patient Re-evaluated:Patient Re-evaluated prior to inductionOxygen Delivery Method: Circle System Utilized Preoxygenation: Pre-oxygenation with 100% oxygen Intubation Type: IV induction Ventilation: Mask ventilation without difficulty Laryngoscope Size: Mac and 3 Grade View: Grade I Tube type: Oral Number of attempts: 1 Airway Equipment and Method: Stylet and Oral airway Placement Confirmation: ETT inserted through vocal cords under direct vision,  positive ETCO2 and breath sounds checked- equal and bilateral Secured at: 21 cm Tube secured with: Tape Dental Injury: Teeth and Oropharynx as per pre-operative assessment

## 2016-12-02 NOTE — Anesthesia Postprocedure Evaluation (Signed)
Anesthesia Post Note  Patient: Toni Andrews  Procedure(s) Performed: Procedure(s) (LRB): OPEN REDUCTION INTERNAL FIXATION (ORIF) ANKLE FRACTURE (Right) INTRAMEDULLARY (IM) NAIL TIBIAL (Right)  Patient location during evaluation: PACU Anesthesia Type: General Level of consciousness: patient cooperative, oriented and sedated Pain management: pain level controlled Vital Signs Assessment: post-procedure vital signs reviewed and stable Respiratory status: spontaneous breathing, nonlabored ventilation, respiratory function stable and patient connected to nasal cannula oxygen Cardiovascular status: blood pressure returned to baseline and stable Postop Assessment: no signs of nausea or vomiting Anesthetic complications: no       Last Vitals:  Vitals:   12/02/16 1600 12/02/16 1615  BP: 130/76 129/75  Pulse: 79   Resp: (!) 22   Temp:  36.7 C    Last Pain:  Vitals:   12/02/16 1615  TempSrc:   PainSc: 7                  Rheanna Sergent,E. Nahal Wanless
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
## 2016-12-03 ENCOUNTER — Encounter (HOSPITAL_COMMUNITY): Payer: Self-pay | Admitting: Orthopedic Surgery

## 2016-12-03 DIAGNOSIS — S82871A Displaced pilon fracture of right tibia, initial encounter for closed fracture: Secondary | ICD-10-CM

## 2016-12-03 DIAGNOSIS — S82241A Displaced spiral fracture of shaft of right tibia, initial encounter for closed fracture: Secondary | ICD-10-CM

## 2016-12-03 HISTORY — DX: Displaced pilon fracture of right tibia, initial encounter for closed fracture: S82.871A

## 2016-12-03 HISTORY — DX: Displaced spiral fracture of shaft of right tibia, initial encounter for closed fracture: S82.241A

## 2016-12-03 MED ORDER — OXYCODONE-ACETAMINOPHEN 5-325 MG PO TABS
1.0000 | ORAL_TABLET | Freq: Once | ORAL | Status: AC
  Administered 2016-12-05: 1 via ORAL
  Filled 2016-12-03: qty 1

## 2016-12-03 MED ORDER — POLYETHYLENE GLYCOL 3350 17 G PO PACK
17.0000 g | PACK | Freq: Every day | ORAL | Status: DC
  Administered 2016-12-03 – 2016-12-05 (×3): 17 g via ORAL
  Filled 2016-12-03 (×2): qty 1

## 2016-12-03 MED ORDER — DOCUSATE SODIUM 100 MG PO CAPS
100.0000 mg | ORAL_CAPSULE | Freq: Two times a day (BID) | ORAL | Status: DC
  Administered 2016-12-03 – 2016-12-05 (×5): 100 mg via ORAL
  Filled 2016-12-03 (×4): qty 1

## 2016-12-03 MED ORDER — FAMOTIDINE 20 MG PO TABS
20.0000 mg | ORAL_TABLET | Freq: Two times a day (BID) | ORAL | Status: DC
  Administered 2016-12-03 – 2016-12-05 (×5): 20 mg via ORAL
  Filled 2016-12-03 (×5): qty 1

## 2016-12-03 MED ORDER — OXYCODONE HCL 5 MG PO TABS
10.0000 mg | ORAL_TABLET | ORAL | Status: DC | PRN
  Administered 2016-12-03 – 2016-12-05 (×11): 20 mg via ORAL
  Filled 2016-12-03 (×11): qty 4
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
## 2016-12-03 NOTE — Progress Notes (Signed)
Patient ID: Toni Andrews, female   DOB: 1986-08-11, 31 y.o.   MRN: [id number]     Subjective:  Patient reports pain as mild.  Patient lethargic but does follow commands.  Denies any CP or SOB  Objective:   VITALS:   Vitals:   12/02/16 1615 12/02/16 2023 12/03/16 0015 12/03/16 0451  BP: 129/75 (!) 153/93 136/81 140/85  Pulse:  98 88 82  Resp:      Temp: 98 F (36.7 C) 99 F (37.2 C)  98.5 F (36.9 C)  TempSrc:  Oral  Oral  SpO2:  98%  95%  Weight:      Height:        ABD soft Sensation intact distally Dorsiflexion/Plantar flexion intact Incision: dressing C/D/I and no drainage EHL FHL firing   Lab Results  Component Value Date   WBC 5.5 12/02/2016   HGB 10.3 (L) 12/02/2016   HCT 31.4 (L) 12/02/2016   MCV 92.4 12/02/2016   PLT 272 12/02/2016   BMET    Component Value Date/Time   NA 137 12/02/2016 0316   K 3.4 (L) 12/02/2016 0316   CL 105 12/02/2016 0316   CO2 25 12/02/2016 0316   GLUCOSE 110 (H) 12/02/2016 0316   BUN 7 12/02/2016 0316   CREATININE 0.64 12/02/2016 0316   CALCIUM 7.5 (L) 12/02/2016 0316   GFRNONAA >60 12/02/2016 0316   GFRAA >60 12/02/2016 0316     Assessment/Plan: 1 Day Post-Op   Active Problems:   Subarachnoid hemorrhage (HCC)   Displaced spiral fracture of shaft of right tibia, initial encounter for closed fracture   Fracture of right tibial plafond without involvement of fibula   Advance diet Up with therapy Continue plan per Trauma Dry dressing PRN    Mahalia Dykes P 12/03/2016, 12:49 PM  Discussed and agree with above.    Teryl LucyJoshua Anita Mcadory, MD Cell [phone]

## 2016-12-03 NOTE — Progress Notes (Signed)
Patient ID: Toni Andrews, female   DOB: Aug 16, 1986, 31 y.o.   MRN: [id number] BP 140/85 (BP Location: Right Arm)   Pulse 82   Temp 98.5 F (36.9 C) (Oral)   Resp (!) 22   Ht 5\' 5"  (1.651 m)   Wt 79.4 kg (175 lb)   LMP 12/01/2016   SpO2 95%   BMI 29.12 kg/m  Alert, slightly confused. Given pain medication recently Follows all commands Head CT shows no worrisome signs, expected blood in the subarachnoid space, will dissipate will not need a repeat scan unless neurological exam were to change.
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
## 2016-12-03 NOTE — Progress Notes (Signed)
PT Cancellation Note  Patient Details Name: Toni Andrews MRN: [id number] DOB: 01-12-1986   Cancelled Treatment:    Reason Eval/Treat Not Completed: Medical issues which prohibited therapy . Check with Rn, requesting PT hold off on evaluation until later this afternoon or tomorrow.   Colin BroachSabra M. Fredia Chittenden PT, DPT  [phone]  12/03/2016, 12:31 PM

## 2016-12-04 MED ORDER — CALCIUM CARBONATE ANTACID 500 MG PO CHEW
1.0000 | CHEWABLE_TABLET | Freq: Three times a day (TID) | ORAL | Status: DC
  Administered 2016-12-04 – 2016-12-05 (×2): 200 mg via ORAL
  Filled 2016-12-04 (×2): qty 1
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
## 2016-12-04 NOTE — Progress Notes (Addendum)
[facility] for [facility] prior to DC home.  OT recommending IP Rehab as well.  Trauma service has placed order for CIR consult.  Admissions coordinator will follow up once rehab MD/PA have weighed in.     Weldon PickingSusan Janashia Parco PT Inpatient Rehab Admissions Coordinator Cell [phone] Office [phone]

## 2016-12-04 NOTE — Progress Notes (Signed)
Patient ID: Toni Andrews, female   DOB: 09/10/1986, 31 y.o.   MRN: [id number]     Subjective:  Patient reports pain as mild.  Patient more alert and doing much better. Denies any CP or SOB  Objective:   VITALS:   Vitals:   12/03/16 0451 12/03/16 1503 12/03/16 2009 12/04/16 0549  BP: 140/85 (!) 147/85 (!) 142/77 138/77  Pulse: 82 100 97 76  Resp:      Temp: 98.5 F (36.9 C) 98.1 F (36.7 C) 98.3 F (36.8 C) 98.5 F (36.9 C)  TempSrc: Oral Oral Oral Oral  SpO2: 95% 100% 100% 95%  Weight:      Height:        ABD soft Sensation intact distally Dorsiflexion/Plantar flexion intact Incision: dressing C/D/I and no drainage Dressing removed and wounds good no sign of infection  Dry dressing applied  Lab Results  Component Value Date   WBC 5.5 12/02/2016   HGB 10.3 (L) 12/02/2016   HCT 31.4 (L) 12/02/2016   MCV 92.4 12/02/2016   PLT 272 12/02/2016   BMET    Component Value Date/Time   NA 137 12/02/2016 0316   K 3.4 (L) 12/02/2016 0316   CL 105 12/02/2016 0316   CO2 25 12/02/2016 0316   GLUCOSE 110 (H) 12/02/2016 0316   BUN 7 12/02/2016 0316   CREATININE 0.64 12/02/2016 0316   CALCIUM 7.5 (L) 12/02/2016 0316   GFRNONAA >60 12/02/2016 0316   GFRAA >60 12/02/2016 0316     Assessment/Plan: 2 Days Post-Op   Active Problems:   Subarachnoid hemorrhage (HCC)   Displaced spiral fracture of shaft of right tibia, initial encounter for closed fracture   Fracture of right tibial plafond without involvement of fibula   Advance diet Up with therapy Continue plan per trauma Continue plan per Dr Carola FrostHandy Dry dressing PRN   Haskel KhanDOUGLAS Toni Andrews 12/04/2016, 12:33 PM   Toni LucyJoshua Landau, MD Cell [phone]
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
## 2016-12-05 ENCOUNTER — Encounter (HOSPITAL_COMMUNITY): Payer: Self-pay | Admitting: Orthopedic Surgery

## 2016-12-05 ENCOUNTER — Inpatient Hospital Stay (HOSPITAL_COMMUNITY)
Admission: RE | Admit: 2016-12-05 | Discharge: 2016-12-10 | DRG: 949 | Disposition: A | Discharge status: Home Health | Payer: Self-pay | Source: Intra-hospital | Attending: Physical Medicine & Rehabilitation | Admitting: Physical Medicine & Rehabilitation

## 2016-12-05 DIAGNOSIS — E8809 Other disorders of plasma-protein metabolism, not elsewhere classified: Secondary | ICD-10-CM

## 2016-12-05 DIAGNOSIS — F32A Depression, unspecified: Secondary | ICD-10-CM

## 2016-12-05 DIAGNOSIS — S069XAA Unspecified intracranial injury with loss of consciousness status unknown, initial encounter: Secondary | ICD-10-CM | POA: Diagnosis present

## 2016-12-05 DIAGNOSIS — I158 Other secondary hypertension: Secondary | ICD-10-CM

## 2016-12-05 DIAGNOSIS — S066X9D Traumatic subarachnoid hemorrhage with loss of consciousness of unspecified duration, subsequent encounter: Principal | ICD-10-CM

## 2016-12-05 DIAGNOSIS — S82241A Displaced spiral fracture of shaft of right tibia, initial encounter for closed fracture: Principal | ICD-10-CM

## 2016-12-05 DIAGNOSIS — S82201D Unspecified fracture of shaft of right tibia, subsequent encounter for closed fracture with routine healing: Secondary | ICD-10-CM

## 2016-12-05 DIAGNOSIS — F1410 Cocaine abuse, uncomplicated: Secondary | ICD-10-CM | POA: Diagnosis present

## 2016-12-05 DIAGNOSIS — F17200 Nicotine dependence, unspecified, uncomplicated: Secondary | ICD-10-CM | POA: Diagnosis present

## 2016-12-05 DIAGNOSIS — S82871S Displaced pilon fracture of right tibia, sequela: Secondary | ICD-10-CM

## 2016-12-05 DIAGNOSIS — S022XXD Fracture of nasal bones, subsequent encounter for fracture with routine healing: Secondary | ICD-10-CM

## 2016-12-05 DIAGNOSIS — S069X1S Unspecified intracranial injury with loss of consciousness of 30 minutes or less, sequela: Secondary | ICD-10-CM

## 2016-12-05 DIAGNOSIS — S82831D Other fracture of upper and lower end of right fibula, subsequent encounter for closed fracture with routine healing: Secondary | ICD-10-CM

## 2016-12-05 DIAGNOSIS — K219 Gastro-esophageal reflux disease without esophagitis: Secondary | ICD-10-CM

## 2016-12-05 DIAGNOSIS — S069X9A Unspecified intracranial injury with loss of consciousness of unspecified duration, initial encounter: Secondary | ICD-10-CM | POA: Diagnosis present

## 2016-12-05 DIAGNOSIS — S82401A Unspecified fracture of shaft of right fibula, initial encounter for closed fracture: Secondary | ICD-10-CM

## 2016-12-05 DIAGNOSIS — T148XXA Other injury of unspecified body region, initial encounter: Secondary | ICD-10-CM

## 2016-12-05 DIAGNOSIS — E46 Unspecified protein-calorie malnutrition: Secondary | ICD-10-CM

## 2016-12-05 DIAGNOSIS — S82401D Unspecified fracture of shaft of right fibula, subsequent encounter for closed fracture with routine healing: Secondary | ICD-10-CM

## 2016-12-05 DIAGNOSIS — K5903 Drug induced constipation: Secondary | ICD-10-CM

## 2016-12-05 DIAGNOSIS — S022XXA Fracture of nasal bones, initial encounter for closed fracture: Secondary | ICD-10-CM

## 2016-12-05 DIAGNOSIS — S066X9A Traumatic subarachnoid hemorrhage with loss of consciousness of unspecified duration, initial encounter: Secondary | ICD-10-CM

## 2016-12-05 DIAGNOSIS — F329 Major depressive disorder, single episode, unspecified: Secondary | ICD-10-CM

## 2016-12-05 DIAGNOSIS — D62 Acute posthemorrhagic anemia: Secondary | ICD-10-CM | POA: Diagnosis present

## 2016-12-05 DIAGNOSIS — E876 Hypokalemia: Secondary | ICD-10-CM

## 2016-12-05 DIAGNOSIS — M62838 Other muscle spasm: Secondary | ICD-10-CM | POA: Diagnosis not present

## 2016-12-05 DIAGNOSIS — T402X5A Adverse effect of other opioids, initial encounter: Secondary | ICD-10-CM

## 2016-12-05 DIAGNOSIS — T1490XA Injury, unspecified, initial encounter: Secondary | ICD-10-CM

## 2016-12-05 DIAGNOSIS — S82831A Other fracture of upper and lower end of right fibula, initial encounter for closed fracture: Secondary | ICD-10-CM

## 2016-12-05 DIAGNOSIS — K5900 Constipation, unspecified: Secondary | ICD-10-CM | POA: Diagnosis present

## 2016-12-05 DIAGNOSIS — I609 Nontraumatic subarachnoid hemorrhage, unspecified: Secondary | ICD-10-CM

## 2016-12-05 DIAGNOSIS — G8918 Other acute postprocedural pain: Secondary | ICD-10-CM

## 2016-12-05 DIAGNOSIS — I10 Essential (primary) hypertension: Secondary | ICD-10-CM | POA: Diagnosis present

## 2016-12-05 DIAGNOSIS — S82201A Unspecified fracture of shaft of right tibia, initial encounter for closed fracture: Secondary | ICD-10-CM

## 2016-12-05 DIAGNOSIS — F411 Generalized anxiety disorder: Secondary | ICD-10-CM

## 2016-12-05 DIAGNOSIS — R739 Hyperglycemia, unspecified: Secondary | ICD-10-CM

## 2016-12-05 DIAGNOSIS — K649 Unspecified hemorrhoids: Secondary | ICD-10-CM

## 2016-12-05 DIAGNOSIS — Z720 Tobacco use: Secondary | ICD-10-CM

## 2016-12-05 MED ORDER — ADULT MULTIVITAMIN W/MINERALS CH
1.0000 | ORAL_TABLET | Freq: Every day | ORAL | Status: DC
Start: 2016-12-06 — End: 2016-12-10
  Administered 2016-12-06 – 2016-12-10 (×5): 1 via ORAL
  Filled 2016-12-05 (×5): qty 1

## 2016-12-05 MED ORDER — ONDANSETRON HCL 4 MG PO TABS: 4.0000 mg | ORAL_TABLET | Freq: Four times a day (QID) | ORAL | Status: DC | PRN

## 2016-12-05 MED ORDER — ONDANSETRON HCL 4 MG/2ML IJ SOLN: 4.0000 mg | Freq: Four times a day (QID) | INTRAMUSCULAR | Status: DC | PRN

## 2016-12-05 MED ORDER — OXYCODONE HCL 5 MG PO TABS
10.0000 mg | ORAL_TABLET | ORAL | Status: DC | PRN
  Administered 2016-12-05 – 2016-12-10 (×28): 20 mg via ORAL
  Filled 2016-12-05 (×28): qty 4

## 2016-12-05 MED ORDER — SORBITOL 70 % SOLN
30.0000 mL | Freq: Every day | Status: DC | PRN
  Administered 2016-12-05 – 2016-12-07 (×2): 30 mL via ORAL
  Filled 2016-12-05 (×2): qty 30

## 2016-12-05 MED ORDER — CALCIUM CARBONATE ANTACID 500 MG PO CHEW
1.0000 | CHEWABLE_TABLET | Freq: Three times a day (TID) | ORAL | Status: DC
  Administered 2016-12-05 – 2016-12-10 (×15): 200 mg via ORAL
  Filled 2016-12-05 (×14): qty 1

## 2016-12-05 MED ORDER — VITAMIN B-1 100 MG PO TABS
100.0000 mg | ORAL_TABLET | Freq: Every day | ORAL | Status: DC
  Administered 2016-12-06 – 2016-12-10 (×5): 100 mg via ORAL
  Filled 2016-12-05 (×5): qty 1

## 2016-12-05 MED ORDER — POLYETHYLENE GLYCOL 3350 17 G PO PACK
17.0000 g | PACK | Freq: Every day | ORAL | Status: DC
  Administered 2016-12-06 – 2016-12-10 (×5): 17 g via ORAL
  Filled 2016-12-05 (×5): qty 1

## 2016-12-05 MED ORDER — DOCUSATE SODIUM 100 MG PO CAPS
100.0000 mg | ORAL_CAPSULE | Freq: Two times a day (BID) | ORAL | Status: DC
  Administered 2016-12-05 – 2016-12-06 (×3): 100 mg via ORAL
  Filled 2016-12-05 (×4): qty 1

## 2016-12-05 MED ORDER — FAMOTIDINE 20 MG PO TABS
20.0000 mg | ORAL_TABLET | Freq: Two times a day (BID) | ORAL | Status: DC
  Administered 2016-12-05 – 2016-12-10 (×10): 20 mg via ORAL
  Filled 2016-12-05 (×10): qty 1

## 2016-12-05 MED ORDER — FOLIC ACID 1 MG PO TABS
1.0000 mg | ORAL_TABLET | Freq: Every day | ORAL | Status: DC
  Administered 2016-12-06 – 2016-12-10 (×5): 1 mg via ORAL
  Filled 2016-12-05 (×5): qty 1
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
## 2016-12-05 NOTE — Progress Notes (Signed)
I met with pt and her Mom at bedside. We discussed goals of an inpt rehab admission. Pt is in agreement to admit today. I will make the arrangements and update Attending service, RN CM and SW. [phone]
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
## 2016-12-05 NOTE — Progress Notes (Addendum)
Patient arrived on unit with RN, NT, mom. Oriented to unit routine. Discussed pain management. Resting in bed, having dinner.   Refused flu shot at screening question, educated about flu season and potential for exposure.
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
## 2016-12-06 ENCOUNTER — Inpatient Hospital Stay (HOSPITAL_COMMUNITY): Payer: Self-pay | Admitting: Occupational Therapy

## 2016-12-06 ENCOUNTER — Inpatient Hospital Stay (HOSPITAL_COMMUNITY): Payer: Self-pay

## 2016-12-06 ENCOUNTER — Inpatient Hospital Stay (HOSPITAL_COMMUNITY): Payer: Self-pay | Admitting: Physical Therapy

## 2016-12-06 ENCOUNTER — Inpatient Hospital Stay (HOSPITAL_COMMUNITY): Payer: Self-pay | Admitting: Speech Pathology

## 2016-12-06 DIAGNOSIS — S069X9S Unspecified intracranial injury with loss of consciousness of unspecified duration, sequela: Secondary | ICD-10-CM

## 2016-12-06 DIAGNOSIS — S069X0S Unspecified intracranial injury without loss of consciousness, sequela: Secondary | ICD-10-CM

## 2016-12-06 DIAGNOSIS — R7309 Other abnormal glucose: Secondary | ICD-10-CM

## 2016-12-06 DIAGNOSIS — E46 Unspecified protein-calorie malnutrition: Secondary | ICD-10-CM

## 2016-12-06 DIAGNOSIS — I158 Other secondary hypertension: Secondary | ICD-10-CM

## 2016-12-06 DIAGNOSIS — R739 Hyperglycemia, unspecified: Secondary | ICD-10-CM

## 2016-12-06 DIAGNOSIS — D62 Acute posthemorrhagic anemia: Secondary | ICD-10-CM

## 2016-12-06 DIAGNOSIS — T1490XA Injury, unspecified, initial encounter: Secondary | ICD-10-CM

## 2016-12-06 DIAGNOSIS — M79609 Pain in unspecified limb: Secondary | ICD-10-CM

## 2016-12-06 DIAGNOSIS — K219 Gastro-esophageal reflux disease without esophagitis: Secondary | ICD-10-CM

## 2016-12-06 LAB — COMPREHENSIVE METABOLIC PANEL
ALT: 378 U/L — ABNORMAL HIGH (ref 14–54)
AST: 302 U/L — ABNORMAL HIGH (ref 15–41)
Albumin: 3.3 g/dL — ABNORMAL LOW (ref 3.5–5.0)
Alkaline Phosphatase: 184 U/L — ABNORMAL HIGH (ref 38–126)
Anion gap: 9 (ref 5–15)
BUN: <5 mg/dL — ABNORMAL LOW (ref 6–20)
CHLORIDE: 100 mmol/L — AB (ref 101–111)
CO2: 29 mmol/L (ref 22–32)
Calcium: 8.9 mg/dL (ref 8.9–10.3)
Creatinine, Ser: 0.61 mg/dL (ref 0.44–1.00)
GFR calc non Af Amer: >60 mL/min (ref >60)
Glucose, Bld: 123 mg/dL — ABNORMAL HIGH (ref 65–99)
POTASSIUM: 4 mmol/L (ref 3.5–5.1)
SODIUM: 138 mmol/L (ref 135–145)
Total Bilirubin: 1.8 mg/dL — ABNORMAL HIGH (ref 0.3–1.2)
Total Protein: 5.9 g/dL — ABNORMAL LOW (ref 6.5–8.1)

## 2016-12-06 LAB — CBC WITH DIFFERENTIAL/PLATELET
BASOS ABS: 0 10*3/uL (ref 0.0–0.1)
Basophils Relative: 0 %
EOS ABS: 0.2 10*3/uL (ref 0.0–0.7)
EOS PCT: 4 %
HCT: 29.7 % — ABNORMAL LOW (ref 36.0–46.0)
Hemoglobin: 9.9 g/dL — ABNORMAL LOW (ref 12.0–15.0)
LYMPHS ABS: 1.4 10*3/uL (ref 0.7–4.0)
LYMPHS PCT: 30 %
MCH: 30.8 pg (ref 26.0–34.0)
MCHC: 33.3 g/dL (ref 30.0–36.0)
MCV: 92.5 fL (ref 78.0–100.0)
MONO ABS: 0.5 10*3/uL (ref 0.1–1.0)
Monocytes Relative: 9 %
Neutro Abs: 2.8 10*3/uL (ref 1.7–7.7)
Neutrophils Relative %: 57 %
PLATELETS: 345 10*3/uL (ref 150–400)
RBC: 3.21 MIL/uL — AB (ref 3.87–5.11)
RDW: 12.4 % (ref 11.5–15.5)
WBC: 4.9 10*3/uL (ref 4.0–10.5)

## 2016-12-06 MED ORDER — DIPHENHYDRAMINE HCL 25 MG PO CAPS
25.0000 mg | ORAL_CAPSULE | Freq: Four times a day (QID) | ORAL | Status: DC | PRN
Start: 2016-12-06 — End: 2016-12-10
  Administered 2016-12-06 – 2016-12-08 (×2): 25 mg via ORAL
  Filled 2016-12-06 (×2): qty 1

## 2016-12-06 MED ORDER — PANTOPRAZOLE SODIUM 20 MG PO TBEC
20.0000 mg | DELAYED_RELEASE_TABLET | Freq: Every day | ORAL | Status: DC
  Administered 2016-12-06 – 2016-12-10 (×5): 20 mg via ORAL
  Filled 2016-12-06 (×5): qty 1

## 2016-12-06 MED ORDER — PRO-STAT SUGAR FREE PO LIQD
30.0000 mL | Freq: Two times a day (BID) | ORAL | Status: DC
  Administered 2016-12-06 – 2016-12-10 (×9): 30 mL via ORAL
  Filled 2016-12-06 (×10): qty 30
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
## 2016-12-06 NOTE — Progress Notes (Signed)
Patient information reviewed and entered into eRehab system by Kmari Brian, RN, CRRN, PPS Coordinator.  Information including medical coding and functional independence measure will be reviewed and updated through discharge.    
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
## 2016-12-06 NOTE — Progress Notes (Signed)
Physical Therapy Session Note  Patient Details  Name: Toni Andrews MRN: [id number] Date of Birth: 07-09-1986  Today's Date: 12/06/2016 PT Individual Time: 1330-1400 PT Individual Time Calculation (min): 30 min   Short Term Goals: Week 1:  PT Short Term Goal 1 (Week 1): = LTGs due to LOS  Skilled Therapeutic Interventions/Progress Updates:  Pt presented in bed agreeable to therapy. Performed stand pivot to w/c with RW with min guard and cues for hand placement. Pt propelled self to rehab gym with good demo of safety with use of RW. Performed gait 56f on compliant surface with min guard and no LOB. Pt able to ambulate back to room approx 135 ft with RW with min cues for keeping close to RW and to monitor speed with activity. Pt reviewed therex handout and able to include modified SLR from elevated position and SAQ x10. Pt requesting to use toilet and performed toilet transfer with supervision. Pt returned to bed with call bell within reach and all current needs met.    Therapy Documentation Precautions:  Precautions Precautions: Fall Precaution Comments: NWB RLE Restrictions Weight Bearing Restrictions: Yes RLE Weight Bearing: Non weight bearing   See Function Navigator for Current Functional Status.   Therapy/Group: Individual Therapy  Anitta Tenny  Katoria Yetman, PTA  12/06/2016, 3:59 PM
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
## 2016-12-06 NOTE — Progress Notes (Signed)
Social Work Patient ID: Toni Andrews, female   DOB: 1986/05/06, 31 y.o.   MRN: [id number]   CSW met pt who had been in therapy and then off the floor for testing.  She was appreciative of CSW visit, but had to use the bathroom, so CSW stated that CSW would return later today or tomorrow.  CSW also informed her that neuropsychologist would be seeing her tomorrow, as well.  Pt was pleased.
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
## 2016-12-06 NOTE — Plan of Care (Signed)
Problem: RH PAIN MANAGEMENT Goal: RH STG PAIN MANAGED AT OR BELOW PT'S PAIN GOAL <3  Outcome: Not Progressing Reports pain as 9     

## 2016-12-06 NOTE — Progress Notes (Signed)
*  PRELIMINARY RESULTS* Vascular Ultrasound Bilateral lower extremity venous duplex has been completed.  Preliminary findings: No evidence of deep vein thrombosis or baker's cysts bilaterally.   Toni DonningCharlotte C Kamariya Blevens 12/06/2016, 1:00 PM

## 2016-12-07 ENCOUNTER — Inpatient Hospital Stay (HOSPITAL_COMMUNITY): Payer: Self-pay | Admitting: Physical Therapy

## 2016-12-07 ENCOUNTER — Ambulatory Visit (HOSPITAL_COMMUNITY): Payer: Self-pay | Admitting: Psychology

## 2016-12-07 ENCOUNTER — Inpatient Hospital Stay (HOSPITAL_COMMUNITY): Payer: Self-pay | Admitting: Occupational Therapy

## 2016-12-07 ENCOUNTER — Inpatient Hospital Stay (HOSPITAL_COMMUNITY): Payer: Self-pay | Admitting: Speech Pathology

## 2016-12-07 DIAGNOSIS — S069X1S Unspecified intracranial injury with loss of consciousness of 30 minutes or less, sequela: Secondary | ICD-10-CM

## 2016-12-07 DIAGNOSIS — K649 Unspecified hemorrhoids: Secondary | ICD-10-CM

## 2016-12-07 DIAGNOSIS — F331 Major depressive disorder, recurrent, moderate: Secondary | ICD-10-CM

## 2016-12-07 DIAGNOSIS — F411 Generalized anxiety disorder: Secondary | ICD-10-CM

## 2016-12-07 DIAGNOSIS — G8918 Other acute postprocedural pain: Secondary | ICD-10-CM

## 2016-12-07 DIAGNOSIS — K5903 Drug induced constipation: Secondary | ICD-10-CM

## 2016-12-07 DIAGNOSIS — T402X5A Adverse effect of other opioids, initial encounter: Secondary | ICD-10-CM

## 2016-12-07 MED ORDER — SENNOSIDES-DOCUSATE SODIUM 8.6-50 MG PO TABS
1.0000 | ORAL_TABLET | Freq: Two times a day (BID) | ORAL | Status: DC
  Administered 2016-12-07 – 2016-12-10 (×7): 1 via ORAL
  Filled 2016-12-07 (×7): qty 1

## 2016-12-07 MED ORDER — WITCH HAZEL-GLYCERIN EX PADS
MEDICATED_PAD | CUTANEOUS | Status: DC | PRN
  Filled 2016-12-07: qty 100

## 2016-12-07 MED ORDER — LIDOCAINE 5 % EX PTCH
1.0000 | MEDICATED_PATCH | CUTANEOUS | Status: DC
  Administered 2016-12-07 – 2016-12-10 (×4): 1 via TRANSDERMAL
  Filled 2016-12-07 (×4): qty 1

## 2016-12-07 NOTE — Progress Notes (Signed)
Occupational Therapy Session Note  Patient Details  Name: Mills KollerKrysta Xxxcarraway MRN: [id number] Date of Birth: 18-Jul-1986  Today's Date: 12/07/2016 OT Individual Time: 1000-1059 OT Individual Time Calculation (min): 59 min    Short Term Goals: Week 1:   STGs=LTGs secondary to estimated short LOS  Skilled Therapeutic Interventions/Progress Updates:    Upon entering the room, pt supine in bed with 5/10 c/o pain in R LE and RN arriving with medications this session. Pt performed sit <> stand from bed with RW and supervision. Pt ambulated 10' with RW while maintaining NWB on R LE with steady assistance. Pt performed shower transfer with steady assistance as well. OT educating and demonstrating how to cover R LE in order to insure stitches did not get wet. Pt returned demonstration with increased time and min verbal cues for technique. Pt performed lateral leans to wash buttocks from TTB. Pt drying from bench and returning to bed secondary to increased R LE pain. Pt donning hospital gown with set up and remaining seated in bed with call bell and all needed items within reach upon exiting the room.   Therapy Documentation Precautions:  Precautions Precautions: Fall Precaution Comments: NWB RLE Restrictions Weight Bearing Restrictions: Yes RLE Weight Bearing: Non weight bearing General:   Vital Signs:   Pain: Pain Assessment Pain Assessment: 0-10 Pain Score: 4  Pain Type: Surgical pain Pain Intervention(s): Repositioned ADL: ADL ADL Comments: see functional navigator Exercises:   Other Treatments:    See Function Navigator for Current Functional Status.   Therapy/Group: Individual Therapy  Alen BleacherBradsher, Malon Siddall P 12/07/2016, 12:41 PM

## 2016-12-07 NOTE — Progress Notes (Signed)
Physical Therapy Session Note  Patient Details  Name: Toni Andrews MRN: [id number] Date of Birth: 01-30-1986  Today's Date: 12/07/2016 PT Individual Time: 1345-1430 & 1510-1540 PT Individual Time Calculation (min): 45 min and 30 min  Short Term Goals: Week 1:  PT Short Term Goal 1 (Week 1): = LTGs due to LOS  Skilled Therapeutic Interventions/Progress Updates:  Tx1:  Pt presented in bed agreeable to therapy. Performed supervision  bed mobility and sit to/from stand transfer with RW.  Ambulated to BR and performed toilet transfer with supervision. Pt performed standing dynamic balance activities including changing bed sheets (per pt request). Required cues for staying within RW when reaching for safety and taking seated rests when needed for pain management. Pt propelled in unit to family room negotiating narrow spaces with supervision. Pt ambulated with RW >282f with RW with occasional cues not to over reach with RW. Pt returned to room via w/c due to fatigue and transferred back to bed with all current needs met.   Tx2: Pt presented in bed agreeable to therapy. Ambulated with RW to rehab gym approx 1460fwith improved technique and improved safety. Performed supine therex including modified SLR, SAQ, hip abd/add x 20 with RLE. Seated LAQ x 15 with RLE within limits of pain. Pt returned to room via w/c due to fatigue and transferred to bed to elevate leg. Pt left in bed with call bell within reach and all needs met.      Therapy Documentation Precautions:  Precautions Precautions: Fall Precaution Comments: NWB RLE Restrictions Weight Bearing Restrictions: Yes RLE Weight Bearing: Non weight bearing   See Function Navigator for Current Functional Status.   Therapy/Group: Individual Therapy  Toni Andrews  Toni Andrews, PTA  12/07/2016, 4:15 PM
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
## 2016-12-07 NOTE — Progress Notes (Signed)
Social Work Patient ID: Toni Andrews, female   DOB: 1986-08-07, 31 y.o.   MRN: [id number]   CSW attempted to meet with pt again today without success.  Neuropsychologist did see pt today and pt told CSW that was very helpful to her.  CSW made a plan to see pt tomorrow.  Pt was agreeable to that.

## 2016-12-08 ENCOUNTER — Inpatient Hospital Stay (HOSPITAL_COMMUNITY): Payer: Self-pay | Admitting: Occupational Therapy

## 2016-12-08 ENCOUNTER — Inpatient Hospital Stay (HOSPITAL_COMMUNITY): Payer: Self-pay | Admitting: Speech Pathology

## 2016-12-08 ENCOUNTER — Inpatient Hospital Stay (HOSPITAL_COMMUNITY): Payer: Self-pay

## 2016-12-08 DIAGNOSIS — S069X1S Unspecified intracranial injury with loss of consciousness of 30 minutes or less, sequela: Secondary | ICD-10-CM

## 2016-12-08 DIAGNOSIS — K5909 Other constipation: Secondary | ICD-10-CM

## 2016-12-08 DIAGNOSIS — T40605A Adverse effect of unspecified narcotics, initial encounter: Secondary | ICD-10-CM

## 2016-12-08 NOTE — Progress Notes (Signed)
Speech Language Pathology Session Note & Discharge Summary  Patient Details  Name: Toni Andrews MRN: [id number] Date of Birth: [birth year]  Today's Date: 12/08/2016 SLP Individual Time: 1300-1330 SLP Individual Time Calculation (min): 30 min   Skilled Therapeutic Interventions:  Skilled treatment session focused on cognitive goals. Upon arrival, patient was on commode and transferred herself back to bed with Mod I for safety. Patient participated in functional conversation that focused on anticipatory awareness and d/c planning with Mod I. Educated was completed and SLP also provided handouts to patient to maximize recall and organization of pain medication administration at home. Patient appears to be at her baseline level of cognitive functioning and will be discharged from skilled SLP intervention, She verbalized understanding and agreement.   Patient has met 2 of 2 long term goals.  Patient to discharge at overall Modified Independent level.   Reasons goals not met: N/A   Clinical Impression/Discharge Summary: Patient has made excellent gains and has met 2 of 2 LTG's this admission. Currently, patient demonstrated behaviors consistent with a Rancho Level VIII and is overall Mod I to complete functional and familiar tasks safely. Patient education is complete and patient will discharge home with assistance form family. Patient is at her baseline level of cognitive functioning, therefore, she will be discharged from skilled SLP intervention and f/u is not warranted at this time.   Care Partner:  Caregiver Able to Provide Assistance: Yes     Recommendation:  None      Equipment: N/A   Reasons for discharge: Treatment goals met   Patient/Family Agrees with Progress Made and Goals Achieved: Yes   Function:  Cognition Comprehension Comprehension assist level: Follows complex conversation/direction with extra time/assistive device  Expression   Expression assist level: Expresses  complex ideas: With extra time/assistive device  Social Interaction Social Interaction assist level: Interacts appropriately with others - No medications needed.  Problem Solving Problem solving assist level: Solves complex problems: With extra time  Memory Memory assist level: Complete Independence: No helper   Buzz Axel 12/08/2016, 4:47 PM

## 2016-12-08 NOTE — Progress Notes (Signed)
Occupational Therapy Session Note  Patient Details  Name: Toni Andrews MRN: [id number] Date of Birth: December 11, 1985  Today's Date: 12/08/2016 OT Individual Time: 1340-1410 OT Individual Time Calculation (min): 30 min    Short Term Goals: n/a  Skilled Therapeutic Interventions/Progress Updates:    Pt seen this session to practice tub bench transfers and recommendations for home. Pt was mod I to get out of bed, use RW to bathroom, toilet, and transfer to w/c. Propelled self to ADL apt and used RW to ambulate into bathroom onto tub bench with mod I. For comfort sake, she used a shower chair outside of tub to prop RLE on.  She has a shower seat at home, but no bench.  Reviewed techniques with curtain and bench to prevent water spillage. Pt returned to room with all needs met.  Therapy Documentation Precautions:  Precautions Precautions: Fall Precaution Comments: NWB RLE Restrictions Weight Bearing Restrictions: Yes RLE Weight Bearing: Non weight bearing   Pain: Pain Assessment Pain Score: 8  ADL: ADL ADL Comments: see functional navigator See Function Navigator for Current Functional Status.   Therapy/Group: Individual Therapy  [place] 12/08/2016, 9:57 AM

## 2016-12-08 NOTE — Progress Notes (Signed)
Physical Therapy Session Note  Patient Details  Name: Toni Andrews MRN: [id number] Date of Birth: Mar 18, 1986  Today's Date: 12/08/2016 PT Individual Time: 0900-1000 PT Individual Time Calculation (min): 60 min   Short Term Goals: Week 1:  PT Short Term Goal 1 (Week 1): = LTGs due to LOS  Skilled Therapeutic Interventions/Progress Updates:   Pt reports overdoing it yesterday and had a rough night due to high pain levels. Focused on d/c planning and home environment mobility training including problem solving tight spaces and safety. Pt completed toilet transfers and mobility in room at modified independent using RW demonstrating maintaining NWB status without cues. Stair negotiation training with technique to ascend backwards with RW to simulate front entrance (no rails, lower steps, and does not have to go across gravel to access). Pt completed with overall min assist at first progressing to supervision. Pt reports likely her friend, Romeo AppleBen, will pick her up from hospital and can assist with stabilizing RW for this technique. Educated on safety with gait over gravel surface and technique. Pt completed w/c mobility on unit including leg rest management modified independent. Simulated SUV height transfer x 2 repetitions with supervision due to cues initially for technique using RW. End of session returned to bed to elevated RLE due to pain. RN to administer pain patch.  Therapy Documentation Precautions:  Precautions Precautions: Fall Precaution Comments: NWB RLE Restrictions Weight Bearing Restrictions: Yes RLE Weight Bearing: Non weight bearing   Pain: 8/10 pain in RLE - premedicated earlier this morning with medication pending after session. RN aware.  See Function Navigator for Current Functional Status.   Therapy/Group: Individual Therapy  Karolee StampsGray, Melquiades Kovar Darrol PokeBrescia  Toni Crandle B. Meagan Spease, PT, DPT  12/08/2016, 11:08 AM

## 2016-12-08 NOTE — Progress Notes (Signed)
Occupational Therapy Session Note  Patient Details  Name: Mills KollerKrysta Xxxcarraway MRN: [id number] Date of Birth: Nov 20, 1985  Today's Date: 12/08/2016 OT Individual Time: 1000-1044 and 1430-1528 OT Individual Time Calculation (min): 44 min and 58 min    Short Term Goals: Week 1:   STGs=LTGs secondary to short LOS   Skilled Therapeutic Interventions/Progress Updates:    Session 1: Upon entering the room, pt supine in bed awaiting therapist with 5/10 c/o pain in R LE but agreeable to OT intervention. Pt made mod I in room this session as she ambulated with RW while maintaining precautions at mod I level. Pt performed bathing at shower level at mod I level and donned hospital gown with set up A at end of session. Call bell and all needed items within reach.   Session 2: Upon entering the room, pt supine in bed with 7/10 pain in R LE reported with movement. Pt reports,  " It is not quite time for my medicine yet." Pt has not had clothing available in the room for dressing tasks. OT provided LB clothing items and demonstrated use of sock aid and LH reacher to don and doff socks and elastic waist pants. Pt returned demonstrations with min verbal cues for proper technique. Pt ambulated with RW and mod I in room for toileting with mod I as pt did not utilize elevated toilet seated or grab bars for transfer. Pt returning to bed at end of session with call bell and all needed items within reach. Pt and OT discussed upcoming discharge recommendations as well. All needed items within reach upon exiting the room.   Therapy Documentation Precautions:  Precautions Precautions: Fall Precaution Comments: NWB RLE Restrictions Weight Bearing Restrictions: Yes RLE Weight Bearing: Non weight bearing General:   Vital Signs:   Pain: Pain Assessment Pain Score: Asleep ADL: ADL ADL Comments: see functional navigator Exercises:   Other Treatments:    See Function Navigator for Current Functional  Status.   Therapy/Group: Individual Therapy  Alen BleacherBradsher, Genine Beckett P 12/08/2016, 12:53 PM
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
## 2016-12-09 ENCOUNTER — Inpatient Hospital Stay (HOSPITAL_COMMUNITY): Payer: Self-pay | Admitting: Occupational Therapy

## 2016-12-09 ENCOUNTER — Inpatient Hospital Stay (HOSPITAL_COMMUNITY): Payer: Self-pay

## 2016-12-09 DIAGNOSIS — S069X2S Unspecified intracranial injury with loss of consciousness of 31 minutes to 59 minutes, sequela: Secondary | ICD-10-CM

## 2016-12-09 LAB — CBC WITH DIFFERENTIAL/PLATELET
Basophils Absolute: 0 10*3/uL (ref 0.0–0.1)
Basophils Relative: 0 %
EOS ABS: 0.2 10*3/uL (ref 0.0–0.7)
EOS PCT: 3 %
HCT: 32.1 % — ABNORMAL LOW (ref 36.0–46.0)
HEMOGLOBIN: 10.4 g/dL — AB (ref 12.0–15.0)
LYMPHS ABS: 1.5 10*3/uL (ref 0.7–4.0)
Lymphocytes Relative: 33 %
MCH: 30.6 pg (ref 26.0–34.0)
MCHC: 32.4 g/dL (ref 30.0–36.0)
MCV: 94.4 fL (ref 78.0–100.0)
MONOS PCT: 10 %
Monocytes Absolute: 0.4 10*3/uL (ref 0.1–1.0)
Neutro Abs: 2.5 10*3/uL (ref 1.7–7.7)
Neutrophils Relative %: 54 %
Platelets: 375 10*3/uL (ref 150–400)
RBC: 3.4 MIL/uL — ABNORMAL LOW (ref 3.87–5.11)
RDW: 13.3 % (ref 11.5–15.5)
WBC: 4.6 10*3/uL (ref 4.0–10.5)

## 2016-12-09 LAB — BASIC METABOLIC PANEL
Anion gap: 10 (ref 5–15)
BUN: 7 mg/dL (ref 6–20)
CO2: 28 mmol/L (ref 22–32)
Calcium: 9.2 mg/dL (ref 8.9–10.3)
Chloride: 100 mmol/L — ABNORMAL LOW (ref 101–111)
Creatinine, Ser: 0.64 mg/dL (ref 0.44–1.00)
GFR calc Af Amer: >60 mL/min (ref >60)
GFR calc non Af Amer: >60 mL/min (ref >60)
Glucose, Bld: 123 mg/dL — ABNORMAL HIGH (ref 65–99)
Potassium: 3.7 mmol/L (ref 3.5–5.1)
Sodium: 138 mmol/L (ref 135–145)

## 2016-12-09 LAB — MAGNESIUM: MAGNESIUM: 2.3 mg/dL (ref 1.7–2.4)

## 2016-12-09 MED ORDER — METHOCARBAMOL 500 MG PO TABS
500.0000 mg | ORAL_TABLET | Freq: Four times a day (QID) | ORAL | Status: DC
  Administered 2016-12-09 – 2016-12-10 (×7): 500 mg via ORAL
  Filled 2016-12-09 (×7): qty 1

## 2016-12-09 MED ORDER — FAMOTIDINE 20 MG PO TABS: 20.0000 mg | ORAL_TABLET | Freq: Two times a day (BID) | ORAL | 0 refills | Status: DC

## 2016-12-09 MED ORDER — METHOCARBAMOL 500 MG PO TABS: 500.0000 mg | ORAL_TABLET | Freq: Four times a day (QID) | ORAL | 0 refills | Status: DC

## 2016-12-09 MED ORDER — POLYETHYLENE GLYCOL 3350 17 G PO PACK: 17.0000 g | PACK | Freq: Every day | ORAL | 0 refills | Status: DC

## 2016-12-09 MED ORDER — LIDOCAINE 5 % EX PTCH: 1.0000 | MEDICATED_PATCH | CUTANEOUS | 0 refills | Status: DC

## 2016-12-09 MED ORDER — CALCIUM CARBONATE ANTACID 500 MG PO CHEW: 1.0000 | CHEWABLE_TABLET | Freq: Three times a day (TID) | ORAL | 0 refills | Status: DC

## 2016-12-09 MED ORDER — SENNOSIDES-DOCUSATE SODIUM 8.6-50 MG PO TABS: 1.0000 | ORAL_TABLET | Freq: Two times a day (BID) | ORAL | Status: DC

## 2016-12-09 MED ORDER — ADULT MULTIVITAMIN W/MINERALS CH: 1.0000 | ORAL_TABLET | Freq: Every day | ORAL | Status: DC

## 2016-12-09 MED ORDER — FOLIC ACID 1 MG PO TABS: 1.0000 mg | ORAL_TABLET | Freq: Every day | ORAL | 0 refills | Status: DC

## 2016-12-09 MED ORDER — OXYCODONE HCL 10 MG PO TABS: 10.0000 mg | ORAL_TABLET | ORAL | 0 refills | Status: DC | PRN
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
## 2016-12-09 NOTE — Plan of Care (Signed)
Problem: RH BOWEL ELIMINATION Goal: RH STG MANAGE BOWEL WITH ASSISTANCE STG Manage Bowel with mod I Assistance.   Outcome: Progressing Pt has had regular bowel movements for 2 days now, Pt will continue to monitor her own needs in this area and reach out for help/prn meds & increased fluids and fiber when needed.

## 2016-12-09 NOTE — Progress Notes (Signed)
Inpatient Rehabilitation Center Individual Statement of Services  Patient Name:  Toni Andrews  Date:  12/08/2016  Welcome to the Inpatient Rehabilitation Center.  Our goal is to provide you with an individualized program based on your diagnosis and situation, designed to meet your specific needs.  With this comprehensive rehabilitation program, you will be expected to participate in at least 3 hours of rehabilitation therapies Monday-Friday, with modified therapy programming on the weekends.  Your rehabilitation program will include the following services:  Physical Therapy (PT), Occupational Therapy (OT), Speech Therapy (ST), 24 hour per day rehabilitation nursing, Neuropsychology, Case Management (Social Worker), Rehabilitation Medicine, Nutrition Services and Pharmacy Services  Weekly team conferences will be held on Wednesdays to discuss your progress.  Your Social Worker will talk with you frequently to get your input and to update you on team discussions.  Team conferences with you and your family in attendance may also be held.  Expected length of stay: 5 to 7 days  Overall anticipated outcome: Modified Independent  Depending on your progress and recovery, your program may change. Your Social Worker will coordinate services and will keep you informed of any changes. Your Social Worker's name and contact numbers are listed  below.  The following services may also be recommended but are not provided by the Inpatient Rehabilitation Center:   Driving Evaluations  Home Health Rehabiltiation Services  Outpatient Rehabilitation Services  Vocational Rehabilitation   Arrangements will be made to provide these services after discharge if needed.  Arrangements include referral to agencies that provide these services.  Your insurance has been verified to be:  None at this time Your primary doctor is:  [facility] for your primary care/hospital  follow up  Pertinent information will be shared with your doctor and your insurance company.  Social Worker:  Staci AcostaJenny Yanni Ruberg, LCSW  [phone] or ([phone]) [phone]  Information discussed with and copy given to patient by: Elvera LennoxPrevatt, Amedee Cerrone Capps, 12/08/2016, 12:29 PM
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
## 2016-12-09 NOTE — Discharge Summary (Signed)
Discharge summary job [id number]

## 2016-12-09 NOTE — Progress Notes (Signed)
Physical Therapy Discharge Summary  Patient Details  Name: Toni Andrews MRN: [id number] Date of Birth: Mar 05, 1986  Today's Date: 12/09/2016 PT Individual Time: 1000-1100 PT Individual Time Calculation (min): 60 min  Premedicated for pain in RLE. Session focused on grad day activities and preparation for discharge. Pt overall modified independent with basic mobility using RW for household distances and w/c for long distances. Pt performed simulated SUV height transfer, gait up/down ramp and over mulched surface to simulate home environment, bed <> w/c transfers, gait on unit x 150', and stair negotiation with RW (ascending backwards) to prepare for home. Education on energy conservation techniques during rest breaks.   Patient has met 9 of 9 long term goals due to improved activity tolerance, improved balance, increased strength, increased range of motion, ability to compensate for deficits, functional use of  right lower extremity, improved attention and improved awareness.  Patient to discharge at an ambulatory level Modified Independent with RW. Pt using w/c for longer distance mobility at modified independent level. Pt's mother to provide assistance at discharge. Pt able to direct care and formal family education not completed. Pt to d/c at [facility]  Reasons goals not met: n/a - all goals met at this time.   Recommendation:  Patient will benefit from ongoing skilled PT services in home health setting to continue to advance safe functional mobility, address ongoing impairments in gait, strength, ROM, endurance, balance, and minimize fall risk.  Equipment: RW. Pt has access to w/c for community use.  Reasons for discharge: treatment goals met and discharge from hospital  Patient/family agrees with progress made and goals achieved: Yes  PT Discharge Precautions/Restrictions Restrictions Weight Bearing Restrictions: Yes RLE Weight Bearing: Non weight  bearing Cognition Overall Cognitive Status: Within Functional Limits for tasks assessed Selective Attention: Appears intact Alternating Attention: Appears intact Memory: Appears intact Awareness: Appears intact Problem Solving: Appears intact Safety/Judgment: Appears intact [employer] Scales of Cognitive Functioning: Purposeful/appropriate Sensation Sensation Light Touch: Appears Intact Proprioception: Appears Intact Coordination Gross Motor Movements are Fluid and Coordinated: Yes (except R ankle and knee due to post op pain) Motor  Motor Motor: Other (comment);Within Functional Limits Motor - Discharge Observations: acute pain and generalized weakness RLE     Trunk/Postural Assessment  Cervical Assessment Cervical Assessment: Within Functional Limits Thoracic Assessment Thoracic Assessment: Within Functional Limits Lumbar Assessment Lumbar Assessment: Within Functional Limits  Balance Balance Balance Assessed: Yes Dynamic Sitting Balance Dynamic Sitting - Level of Assistance: 7: Independent Dynamic Standing Balance Dynamic Standing - Level of Assistance: 6: Modified independent (Device/Increase time) Extremity Assessment   see OT summary for UE details.   RLE Assessment RLE Assessment: Exceptions to [facility] RLE Strength RLE Overall Strength Comments: limited due acute pain and weakness; decreased active ankle dorsiflexion noted. hip WFl and knee limited by pain grossly 3-/5 LLE Assessment LLE Assessment: Within Functional Limits   See Function Navigator for Current Functional Status.  Canary Brim Ivory Broad, PT, DPT  12/09/2016, 2:49 PM

## 2016-12-09 NOTE — Discharge Summary (Signed)
Occupational Therapy Discharge Summary  Patient Details  Name: Toni Andrews MRN: [id number] Date of Birth: 08-02-1986  Today's Date: 12/09/2016 OT Individual Time: 2952-8413 and 0902-1000 OT Individual Time Calculation (min): 61 min and 58 min   Patient has met 10 of 10 long term goals due to improved activity tolerance, improved balance, ability to compensate for deficits and improved awareness.  Patient to discharge at overall Modified Independent level.  Patient has been d/c at Mod I level to mother's house and will not require additional assistance.   Reasons goals not met: All goals met.   Recommendation:  No follow up OT services recommended at this time  Equipment: Tub bench, BSC  Reasons for discharge: treatment goals met  Patient/family agrees with progress made and goals achieved: Yes  Skilled Therapeutic Intervention:  Pt was lying in bed at time of arrival, agreeable to tx though reporting 8/10 pain in R LE. RN made aware and pt medicated during tx. Pt wrapped R LE in prep for shower with setup. Pt ambulated to tub bench in tub room with RW at Mod I level. Pt elevated R LE on side of tub for comfort and completed bathing at Mod I level. Dressing was then completed w/c leve with Mod I and use of AE. Due to fatigue and pain, pt was returned to room via w/c and transferred to bed with RW at Mod I level. She was left with all needs within reach at time of departure.   2nd Session 1:1 Tx (61 min) Skilled OT session completed with focus on d/c planning. Pt reported needing to void at time of arrival, ambulated to toilet/completed toileting tasks with RW and Mod I. Pt had various questions regarding home access, including being able to take naps on mother's elevated bed, meal prep w/c level, and side stepping into living room area to sit on couch with RW. Pt completed all of these tasks at [place] I level with suggestions/input from OT regarding safety and environmental  modifications. Pt very receptive to education and active collaboration. Afterwards she self propelled back to room and transferred to toilet with RW. Pt is at Mod I level in room and therefore required no additional assistance at time of departure.  OT Discharge Precautions/Restrictions  Precautions Precautions: Fall Precaution Comments: NWB RLE Restrictions Weight Bearing Restrictions: Yes RLE Weight Bearing: Non weight bearing General   Vital Signs Therapy Vitals Temp: 98.6 F (37 C) Temp Source: Oral Pulse Rate: 96 Resp: 18 BP: (!) 141/93 Patient Position (if appropriate): Sitting Oxygen Therapy SpO2: 99 % O2 Device: Not Delivered Pain Pt medicated during both sessions due to R LE pain Pain Assessment Pain Assessment: 0-10 Pain Score: 6  Pain Type: Acute pain Pain Location: Leg Pain Orientation: Right Pain Descriptors / Indicators: Aching Pain Frequency: Constant Pain Onset: On-going Patients Stated Pain Goal: 3 Pain Intervention(s): Medication (See eMAR) ADL ADL ADL Comments: Please see functional navigator for ADL status Vision/Perception  Vision- History Baseline Vision/History: No visual deficits Patient Visual Report: No change from baseline Vision- Assessment Vision Assessment?: No apparent visual deficits  Cognition Overall Cognitive Status: Within Functional Limits for tasks assessed Arousal/Alertness: Awake/alert Orientation Level: Oriented X4 Attention: Selective;Alternating Selective Attention: Appears intact Alternating Attention: Appears intact Memory: Appears intact Awareness: Appears intact Problem Solving: Appears intact Safety/Judgment: Appears intact [employer] Scales of Cognitive Functioning: Purposeful/appropriate Sensation Sensation Light Touch: Appears Intact Stereognosis: Appears Intact Hot/Cold: Appears Intact Proprioception: Appears Intact Coordination Gross Motor Movements are Fluid and Coordinated:  Yes Fine Motor  Movements are Fluid and Coordinated: Yes Motor  Motor Motor: Other (comment);Within Functional Limits Motor - Discharge Observations:  (pain and R LE weakness) Mobility  Transfers Sit to Stand: 6: Modified independent (Device/Increase time) Stand to Sit: 6: Modified independent (Device/Increase time)  Trunk/Postural Assessment  Cervical Assessment Cervical Assessment: Within Functional Limits Thoracic Assessment Thoracic Assessment: Within Functional Limits Lumbar Assessment Lumbar Assessment: Within Functional Limits Postural Control Postural Control: Within Functional Limits  Balance Balance Balance Assessed: Yes Dynamic Sitting Balance Dynamic Sitting - Balance Support: During functional activity Dynamic Sitting - Level of Assistance: 7: Independent Dynamic Standing Balance Dynamic Standing - Balance Support: During functional activity Dynamic Standing - Level of Assistance: 6: Modified independent (Device/Increase time) Extremity/Trunk Assessment RUE Assessment RUE Assessment: Within Functional Limits LUE Assessment LUE Assessment: Within Functional Limits   See Function Navigator for Current Functional Status.  [name] A Marliss Buttacavoli 12/09/2016, 4:15 PM
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
## 2016-12-09 NOTE — Progress Notes (Signed)
Social Work Patient ID: Toni Andrews, female   DOB: Dec 15, 1985, 31 y.o.   MRN: [id number]   CSW met with pt 12-08-16 to update her on team conference discussion and targeted d/c date of 12-10-16.  Pt feels she will be ready by then, but she will miss the staff and the care she received on CIR.  Her mother will assist her as needed and she feels safe going home.  She may ask a friend to transport her to mother's home to keep her mother from having to leave grandmother at home.  CSW talked with pt about d/c plans and resources and she was very appreciative of support and resources.  CSW remains available, as needed, to assist.

## 2016-12-10 LAB — HEMOGLOBIN A1C
Hgb A1c MFr Bld: 4.9 % (ref 4.8–5.6)
Mean Plasma Glucose: 94 mg/dL
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
## 2016-12-10 NOTE — Progress Notes (Signed)
Patient discharged home.  Left floor via wheelchair, escorted by nursing staff and family.  Patient verbalized understanding of discharge instructions as given by Deatra Inaan Angiulli, PA.  All patient belongings, including DME sent with patient.  Appears to be in no immediate distress at this time.  Dani Gobbleeardon, Grae Leathers J, RN
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
## 2016-12-19 ENCOUNTER — Ambulatory Visit: Payer: Self-pay | Attending: Internal Medicine | Admitting: Internal Medicine

## 2016-12-19 ENCOUNTER — Encounter: Payer: Self-pay | Admitting: Internal Medicine

## 2016-12-19 VITALS — BP 139/84 | HR 72 | Temp 98.4°F | Resp 16 | Wt 172.4 lb

## 2016-12-19 DIAGNOSIS — S82241A Displaced spiral fracture of shaft of right tibia, initial encounter for closed fracture: Secondary | ICD-10-CM

## 2016-12-19 DIAGNOSIS — Z23 Encounter for immunization: Secondary | ICD-10-CM

## 2016-12-19 DIAGNOSIS — I609 Nontraumatic subarachnoid hemorrhage, unspecified: Secondary | ICD-10-CM | POA: Insufficient documentation

## 2016-12-19 DIAGNOSIS — S82241D Displaced spiral fracture of shaft of right tibia, subsequent encounter for closed fracture with routine healing: Secondary | ICD-10-CM | POA: Insufficient documentation

## 2016-12-19 DIAGNOSIS — I10 Essential (primary) hypertension: Secondary | ICD-10-CM | POA: Insufficient documentation

## 2016-12-19 DIAGNOSIS — S066X9A Traumatic subarachnoid hemorrhage with loss of consciousness of unspecified duration, initial encounter: Secondary | ICD-10-CM

## 2016-12-19 NOTE — Patient Instructions (Addendum)
- financial aid packet.  -  DASH Eating Plan DASH stands for "Dietary Approaches to Stop Hypertension." The DASH eating plan is a healthy eating plan that has been shown to reduce high blood pressure (hypertension). Additional health benefits may include reducing the risk of type 2 diabetes mellitus, heart disease, and stroke. The DASH eating plan may also help with weight loss. What do I need to know about the DASH eating plan? For the DASH eating plan, you will follow these general guidelines:  Choose foods with less than 150 milligrams of sodium per serving (as listed on the food label).  Use salt-free seasonings or herbs instead of table salt or sea salt.  Check with your health care provider or pharmacist before using salt substitutes.  Eat lower-sodium products. These are often labeled as "low-sodium" or "no salt added."  Eat fresh foods. Avoid eating a lot of canned foods.  Eat more vegetables, fruits, and low-fat dairy products.  Choose whole grains. Look for the word "whole" as the first word in the ingredient list.  Choose fish and skinless chicken or [place] more often than red meat. Limit fish, poultry, and meat to 6 oz (170 g) each day.  Limit sweets, desserts, sugars, and sugary drinks.  Choose heart-healthy fats.  Eat more home-cooked food and less restaurant, buffet, and fast food.  Limit fried foods.  Do not fry foods. Cook foods using methods such as baking, boiling, grilling, and broiling instead.  When eating at a restaurant, ask that your food be prepared with less salt, or no salt if possible. What foods can I eat? Seek help from a dietitian for individual calorie needs. Grains  Whole grain or whole wheat bread. Brown rice. Whole grain or whole wheat pasta. Quinoa, bulgur, and whole grain cereals. Low-sodium cereals. Corn or whole wheat flour tortillas. Whole grain cornbread. Whole grain crackers. Low-sodium crackers. Vegetables  Fresh or frozen  vegetables (raw, steamed, roasted, or grilled). Low-sodium or reduced-sodium tomato and vegetable juices. Low-sodium or reduced-sodium tomato sauce and paste. Low-sodium or reduced-sodium canned vegetables. Fruits  All fresh, canned (in natural juice), or frozen fruits. Meat and Other Protein Products  Ground beef (85% or leaner), grass-fed beef, or beef trimmed of fat. Skinless chicken or [place]. Ground chicken or [place]. Pork trimmed of fat. All fish and seafood. Eggs. Dried beans, peas, or lentils. Unsalted nuts and seeds. Unsalted canned beans. Dairy  Low-fat dairy products, such as skim or 1% milk, 2% or reduced-fat cheeses, low-fat ricotta or cottage cheese, or plain low-fat yogurt. Low-sodium or reduced-sodium cheeses. Fats and Oils  Tub margarines without trans fats. Light or reduced-fat mayonnaise and salad dressings (reduced sodium). Avocado. Safflower, olive, or canola oils. Natural peanut or almond butter. Other  Unsalted popcorn and pretzels. The items listed above may not be a complete list of recommended foods or beverages. Contact your dietitian for more options.  What foods are not recommended? Grains  White bread. White pasta. White rice. Refined cornbread. Bagels and croissants. Crackers that contain trans fat. Vegetables  Creamed or fried vegetables. Vegetables in a cheese sauce. Regular canned vegetables. Regular canned tomato sauce and paste. Regular tomato and vegetable juices. Fruits  Canned fruit in light or heavy syrup. Fruit juice. Meat and Other Protein Products  Fatty cuts of meat. Ribs, chicken wings, bacon, sausage, bologna, salami, chitterlings, fatback, hot dogs, bratwurst, and packaged luncheon meats. Salted nuts and seeds. Canned beans with salt. Dairy  Whole or 2% milk, cream, half-and-half, and cream  cheese. Whole-fat or sweetened yogurt. Full-fat cheeses or blue cheese. Nondairy creamers and whipped toppings. Processed cheese, cheese spreads, or cheese  curds. Condiments  Onion and garlic salt, seasoned salt, table salt, and sea salt. Canned and packaged gravies. Worcestershire sauce. Tartar sauce. Barbecue sauce. Teriyaki sauce. Soy sauce, including reduced sodium. Steak sauce. Fish sauce. Oyster sauce. Cocktail sauce. Horseradish. Ketchup and mustard. Meat flavorings and tenderizers. Bouillon cubes. Hot sauce. Tabasco sauce. Marinades. Taco seasonings. Relishes. Fats and Oils  Butter, stick margarine, lard, shortening, ghee, and bacon fat. Coconut, palm kernel, or palm oils. Regular salad dressings. Other  Pickles and olives. Salted popcorn and pretzels. The items listed above may not be a complete list of foods and beverages to avoid. Contact your dietitian for more information.  Where can I find more information? National Heart, Lung, and Blood Institute: CablePromo.it This information is not intended to replace advice given to you by your health care provider. Make sure you discuss any questions you have with your health care provider. Document Released: 10/13/2011 Document Revised: 03/31/2016 Document Reviewed: 08/28/2013 Elsevier Interactive Patient Education  2017 Elsevier Inc.  -  Hypertension Hypertension is another name for high blood pressure. High blood pressure forces your heart to work harder to pump blood. A blood pressure reading has two numbers, which includes a higher number over a lower number (example: 110/72). Follow these instructions at home:  Have your blood pressure rechecked by your doctor.  Only take medicine as told by your doctor. Follow the directions carefully. The medicine does not work as well if you skip doses. Skipping doses also puts you at risk for problems.  Do not smoke.  Monitor your blood pressure at home as told by your doctor. Contact a doctor if:  You think you are having a reaction to the medicine you are taking.  You have repeat headaches or feel  dizzy.  You have puffiness (swelling) in your ankles.  You have trouble with your vision. Get help right away if:  You get a very bad headache and are confused.  You feel weak, numb, or faint.  You get chest or belly (abdominal) pain.  You throw up (vomit).  You cannot breathe very well. This information is not intended to replace advice given to you by your health care provider. Make sure you discuss any questions you have with your health care provider. Document Released: 04/11/2008 Document Revised: 03/31/2016 Document Reviewed: Td Vaccine (Tetanus and Diphtheria): What You Need to Know 1. Why get vaccinated? Tetanus  and diphtheria are very serious diseases. They are rare in the [place] today, but people who do become infected often have severe complications. Td vaccine is used to protect adolescents and adults from both of these diseases. Both tetanus and diphtheria are infections caused by bacteria. Diphtheria spreads from person to person through coughing or sneezing. Tetanus-causing bacteria enter the body through cuts, scratches, or wounds. TETANUS (lockjaw) causes painful muscle tightening and stiffness, usually all over the body.  It can lead to tightening of muscles in the head and neck so you can't open your mouth, swallow, or sometimes even breathe. Tetanus kills about 1 out of every 10 people who are infected even after receiving the best medical care. DIPHTHERIA can cause a thick coating to form in the back of the throat.  It can lead to breathing problems, paralysis, heart failure, and death. Before vaccines, as many as 200,000 cases of diphtheria and hundreds of cases of tetanus were reported in  the [place] each year. Since vaccination began, reports of cases for both diseases have dropped by about 99%. 2. Td vaccine Td vaccine can protect adolescents and adults from tetanus and diphtheria. Td is usually given as a booster dose every 10 years but it can also  be given earlier after a severe and dirty wound or burn. Another vaccine, called Tdap, which protects against pertussis in addition to tetanus and diphtheria, is sometimes recommended instead of Td vaccine. Your doctor or the person giving you the vaccine can give you more information. Td may safely be given at the same time as other vaccines. 3. Some people should not get this vaccine  A person who has ever had a life-threatening allergic reaction after a previous dose of any tetanus or diphtheria containing vaccine, OR has a severe allergy to any part of this vaccine, should not get Td vaccine. Tell the person giving the vaccine about any severe allergies.  Talk to your doctor if you:  had severe pain or swelling after any vaccine containing diphtheria or tetanus,  ever had a condition called Guillain Barre Syndrome (GBS),  aren't feeling well on the day the shot is scheduled. 4. What are the risks from Td vaccine? With any medicine, including vaccines, there is a chance of side effects. These are usually mild and go away on their own. Serious reactions are also possible but are rare. Most people who get Td vaccine do not have any problems with it. Mild problems following Td vaccine: (Did not interfere with activities)  Pain where the shot was given (about 8 people in 10)  Redness or swelling where the shot was given (about 1 person in 4)  Mild fever (rare)  Headache (about 1 person in 4)  Tiredness (about 1 person in 4) Moderate problems following Td vaccine: (Interfered with activities, but did not require medical attention)  Fever over 102F (rare) Severe problems following Td vaccine: (Unable to perform usual activities; required medical attention)  Swelling, severe pain, bleeding and/or redness in the arm where the shot was given (rare). Problems that could happen after any vaccine:  People sometimes faint after a medical procedure, including vaccination. Sitting or  lying down for about 15 minutes can help prevent fainting, and injuries caused by a fall. Tell your doctor if you feel dizzy, or have vision changes or ringing in the ears.  Some people get severe pain in the shoulder and have difficulty moving the arm where a shot was given. This happens very rarely.  Any medication can cause a severe allergic reaction. Such reactions from a vaccine are very rare, estimated at fewer than 1 in a million doses, and would happen within a few minutes to a few hours after the vaccination. As with any medicine, there is a very remote chance of a vaccine causing a serious injury or death. The safety of vaccines is always being monitored. For more information, visit: http://floyd.org/www.cdc.gov/vaccinesafety/ 5. What if there is a serious reaction? What should I look for? Look for anything that concerns you, such as signs of a severe allergic reaction, very high fever, or unusual behavior. Signs of a severe allergic reaction can include hives, swelling of the face and throat, difficulty breathing, a fast heartbeat, dizziness, and weakness. These would usually start a few minutes to a few hours after the vaccination. What should I do?  If you think it is a severe allergic reaction or other emergency that can't wait, call 9-1-1 or get the  person to the nearest hospital. Otherwise, call your doctor.  Afterward, the reaction should be reported to the Vaccine Adverse Event Reporting System (VAERS). Your doctor might file this report, or you can do it yourself through the VAERS web site at www.vaers.LAgents.no, or by calling 1-508-153-5208.  VAERS does not give medical advice. 6. The National Vaccine Injury Compensation Program The [employer] Vaccine Injury Compensation Program (VICP) is a federal program that was created to compensate people who may have been injured by certain vaccines. Persons who believe they may have been injured by a vaccine can learn about the program and about filing a  claim by calling 1-364-218-7364 or visiting the VICP website at SpiritualWord.at. There is a time limit to file a claim for compensation. 7. How can I learn more?  Ask your doctor. He or she can give you the vaccine package insert or suggest other sources of information.  Call your local or state health department.  Contact the Centers for Disease Control and Prevention (CDC):  Call (860)598-0838 (1-800-CDC-INFO)  Visit CDC's website at PicCapture.uy CDC Td Vaccine VIS (02/16/16) This information is not intended to replace advice given to you by your health care provider. Make sure you discuss any questions you have with your health care provider. Document Released: 08/21/2006 Document Revised: 07/14/2016 Document Reviewed: 07/14/2016 Elsevier Interactive Patient Education  2017 Elsevier Inc. 08/16/2013 Elsevier Interactive Patient Education  2017 [employer].
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
## 2017-01-05 ENCOUNTER — Encounter: Payer: Self-pay | Attending: Physical Medicine & Rehabilitation | Admitting: Physical Medicine & Rehabilitation

## 2017-01-05 ENCOUNTER — Encounter: Payer: Self-pay | Admitting: Physical Medicine & Rehabilitation

## 2017-01-05 VITALS — BP 136/92 | HR 84 | Resp 14

## 2017-01-05 DIAGNOSIS — I10 Essential (primary) hypertension: Secondary | ICD-10-CM | POA: Insufficient documentation

## 2017-01-05 DIAGNOSIS — S82401D Unspecified fracture of shaft of right fibula, subsequent encounter for closed fracture with routine healing: Secondary | ICD-10-CM | POA: Insufficient documentation

## 2017-01-05 DIAGNOSIS — Z9889 Other specified postprocedural states: Secondary | ICD-10-CM | POA: Insufficient documentation

## 2017-01-05 DIAGNOSIS — K5900 Constipation, unspecified: Secondary | ICD-10-CM | POA: Insufficient documentation

## 2017-01-05 DIAGNOSIS — T148XXA Other injury of unspecified body region, initial encounter: Secondary | ICD-10-CM

## 2017-01-05 DIAGNOSIS — R52 Pain, unspecified: Secondary | ICD-10-CM | POA: Insufficient documentation

## 2017-01-05 DIAGNOSIS — S066X9A Traumatic subarachnoid hemorrhage with loss of consciousness of unspecified duration, initial encounter: Secondary | ICD-10-CM

## 2017-01-05 DIAGNOSIS — S82891D Other fracture of right lower leg, subsequent encounter for closed fracture with routine healing: Secondary | ICD-10-CM | POA: Insufficient documentation

## 2017-01-05 DIAGNOSIS — F419 Anxiety disorder, unspecified: Secondary | ICD-10-CM | POA: Insufficient documentation

## 2017-01-05 DIAGNOSIS — F17200 Nicotine dependence, unspecified, uncomplicated: Secondary | ICD-10-CM | POA: Insufficient documentation

## 2017-01-05 DIAGNOSIS — F1410 Cocaine abuse, uncomplicated: Secondary | ICD-10-CM

## 2017-01-05 DIAGNOSIS — R269 Unspecified abnormalities of gait and mobility: Secondary | ICD-10-CM | POA: Insufficient documentation

## 2017-01-05 DIAGNOSIS — S82871S Displaced pilon fracture of right tibia, sequela: Secondary | ICD-10-CM

## 2017-01-05 DIAGNOSIS — Z720 Tobacco use: Secondary | ICD-10-CM

## 2017-01-05 DIAGNOSIS — F329 Major depressive disorder, single episode, unspecified: Secondary | ICD-10-CM | POA: Insufficient documentation

## 2017-01-05 DIAGNOSIS — G8918 Other acute postprocedural pain: Secondary | ICD-10-CM

## 2017-01-05 DIAGNOSIS — S82201D Unspecified fracture of shaft of right tibia, subsequent encounter for closed fracture with routine healing: Secondary | ICD-10-CM | POA: Insufficient documentation

## 2017-01-05 DIAGNOSIS — S022XXD Fracture of nasal bones, subsequent encounter for fracture with routine healing: Secondary | ICD-10-CM | POA: Insufficient documentation
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
## 2017-01-09 ENCOUNTER — Encounter: Payer: Self-pay | Admitting: Internal Medicine

## 2017-01-09 ENCOUNTER — Ambulatory Visit: Payer: Self-pay | Attending: Internal Medicine | Admitting: Internal Medicine

## 2017-01-09 VITALS — BP 146/95 | HR 79 | Temp 99.1°F | Resp 16 | Wt 168.0 lb

## 2017-01-09 DIAGNOSIS — Z79899 Other long term (current) drug therapy: Secondary | ICD-10-CM | POA: Insufficient documentation

## 2017-01-09 DIAGNOSIS — Z124 Encounter for screening for malignant neoplasm of cervix: Secondary | ICD-10-CM

## 2017-01-09 DIAGNOSIS — Z01419 Encounter for gynecological examination (general) (routine) without abnormal findings: Secondary | ICD-10-CM | POA: Insufficient documentation

## 2017-01-09 DIAGNOSIS — I10 Essential (primary) hypertension: Secondary | ICD-10-CM | POA: Insufficient documentation

## 2017-01-09 MED ORDER — HYDROCHLOROTHIAZIDE 25 MG PO TABS: 25.0000 mg | ORAL_TABLET | Freq: Every day | ORAL | 3 refills | Status: DC

## 2017-01-09 NOTE — Patient Instructions (Addendum)
Low-Sodium Eating Plan Sodium, which is an element that makes up salt, helps you maintain a healthy balance of fluids in your body. Too much sodium can increase your blood pressure and cause fluid and waste to be held in your body. Your health care provider or dietitian may recommend following this plan if you have high blood pressure (hypertension), kidney disease, liver disease, or heart failure. Eating less sodium can help lower your blood pressure, reduce swelling, and protect your heart, liver, and kidneys. What are tips for following this plan? General guidelines   Most people on this plan should limit their sodium intake to 1,500-2,000 mg (milligrams) of sodium each day. Reading food labels   The Nutrition Facts label lists the amount of sodium in one serving of the food. If you eat more than one serving, you must multiply the listed amount of sodium by the number of servings.  Choose foods with less than 140 mg of sodium per serving.  Avoid foods with 300 mg of sodium or more per serving. Shopping   Look for lower-sodium products, often labeled as "low-sodium" or "no salt added."  Always check the sodium content even if foods are labeled as "unsalted" or "no salt added".  Buy fresh foods.  Avoid canned foods and premade or frozen meals.  Avoid canned, cured, or processed meats  Buy breads that have less than 80 mg of sodium per slice. Cooking   Eat more home-cooked food and less restaurant, buffet, and fast food.  Avoid adding salt when cooking. Use salt-free seasonings or herbs instead of table salt or sea salt. Check with your health care provider or pharmacist before using salt substitutes.  Cook with plant-based oils, such as canola, sunflower, or olive oil. Meal planning   When eating at a restaurant, ask that your food be prepared with less salt or no salt, if possible.  Avoid foods that contain MSG (monosodium glutamate). MSG is sometimes added to [place] food,  bouillon, and some canned foods. What foods are recommended? The items listed may not be a complete list. Talk with your dietitian about what dietary choices are best for you. Grains  Low-sodium cereals, including oats, puffed wheat and rice, and shredded wheat. Low-sodium crackers. Unsalted rice. Unsalted pasta. Low-sodium bread. Whole-grain breads and whole-grain pasta. Vegetables  Fresh or frozen vegetables. "No salt added" canned vegetables. "No salt added" tomato sauce and paste. Low-sodium or reduced-sodium tomato and vegetable juice. Fruits  Fresh, frozen, or canned fruit. Fruit juice. Meats and other protein foods  Fresh or frozen (no salt added) meat, poultry, seafood, and fish. Low-sodium canned tuna and salmon. Unsalted nuts. Dried peas, beans, and lentils without added salt. Unsalted canned beans. Eggs. Unsalted nut butters. Dairy  Milk. Soy milk. Cheese that is naturally low in sodium, such as ricotta cheese, fresh mozzarella, or Swiss cheese Low-sodium or reduced-sodium cheese. Cream cheese. Yogurt. Fats and oils  Unsalted butter. Unsalted margarine with no trans fat. Vegetable oils such as canola or olive oils. Seasonings and other foods  Fresh and dried herbs and spices. Salt-free seasonings. Low-sodium mustard and ketchup. Sodium-free salad dressing. Sodium-free light mayonnaise. Fresh or refrigerated horseradish. Lemon juice. Vinegar. Homemade, reduced-sodium, or low-sodium soups. Unsalted popcorn and pretzels. Low-salt or salt-free chips. What foods are not recommended? The items listed may not be a complete list. Talk with your dietitian about what dietary choices are best for you. Grains  Instant hot cereals. Bread stuffing, pancake, and biscuit mixes. Croutons. Seasoned rice or pasta  mixes. Noodle soup cups. Boxed or frozen macaroni and cheese. Regular salted crackers. Self-rising flour. Vegetables  Sauerkraut, pickled vegetables, and relishes. Olives. [place] fries. Onion  rings. Regular canned vegetables (not low-sodium or reduced-sodium). Regular canned tomato sauce and paste (not low-sodium or reduced-sodium). Regular tomato and vegetable juice (not low-sodium or reduced-sodium). Frozen vegetables in sauces. Meats and other protein foods  Meat or fish that is salted, canned, smoked, spiced, or pickled. Bacon, ham, sausage, hotdogs, corned beef, chipped beef, packaged lunch meats, salt pork, jerky, pickled herring, anchovies, regular canned tuna, sardines, salted nuts. Dairy  Processed cheese and cheese spreads. Cheese curds. Blue cheese. Feta cheese. String cheese. Regular cottage cheese. Buttermilk. Canned milk. Fats and oils  Salted butter. Regular margarine. Ghee. Bacon fat. Seasonings and other foods  Onion salt, garlic salt, seasoned salt, table salt, and sea salt. Canned and packaged gravies. Worcestershire sauce. Tartar sauce. Barbecue sauce. Teriyaki sauce. Soy sauce, including reduced-sodium. Steak sauce. Fish sauce. Oyster sauce. Cocktail sauce. Horseradish that you find on the shelf. Regular ketchup and mustard. Meat flavorings and tenderizers. Bouillon cubes. Hot sauce and Tabasco sauce. Premade or packaged marinades. Premade or packaged taco seasonings. Relishes. Regular salad dressings. Salsa. Potato and tortilla chips. Corn chips and puffs. Salted popcorn and pretzels. Canned or dried soups. Pizza. Frozen entrees and pot pies. Summary  Eating less sodium can help lower your blood pressure, reduce swelling, and protect your heart, liver, and kidneys.  Most people on this plan should limit their sodium intake to 1,500-2,000 mg (milligrams) of sodium each day.  Canned, boxed, and frozen foods are high in sodium. Restaurant foods, fast foods, and pizza are also very high in sodium. You also get sodium by adding salt to food.  Try to cook at home, eat more fresh fruits and vegetables, and eat less fast food, canned, processed, or prepared foods. This  information is not intended to replace advice given to you by your health care provider. Make sure you discuss any questions you have with your health care provider. Document Released: 04/15/2002 Document Revised: 10/17/2016 Document Reviewed: 10/17/2016 Elsevier Interactive Patient Education  2017 Elsevier Inc.  -  Low-Sodium Eating Plan Sodium, which is an element that makes up salt, helps you maintain a healthy balance of fluids in your body. Too much sodium can increase your blood pressure and cause fluid and waste to be held in your body. Your health care provider or dietitian may recommend following this plan if you have high blood pressure (hypertension), kidney disease, liver disease, or heart failure. Eating less sodium can help lower your blood pressure, reduce swelling, and protect your heart, liver, and kidneys. What are tips for following this plan? General guidelines   Most people on this plan should limit their sodium intake to 1,500-2,000 mg (milligrams) of sodium each day. Reading food labels   The Nutrition Facts label lists the amount of sodium in one serving of the food. If you eat more than one serving, you must multiply the listed amount of sodium by the number of servings.  Choose foods with less than 140 mg of sodium per serving.  Avoid foods with 300 mg of sodium or more per serving. Shopping   Look for lower-sodium products, often labeled as "low-sodium" or "no salt added."  Always check the sodium content even if foods are labeled as "unsalted" or "no salt added".  Buy fresh foods.  Avoid canned foods and premade or frozen meals.  Avoid canned, cured, or processed meats  Buy breads that have less than 80 mg of sodium per slice. Cooking   Eat more home-cooked food and less restaurant, buffet, and fast food.  Avoid adding salt when cooking. Use salt-free seasonings or herbs instead of table salt or sea salt. Check with your health care provider or  pharmacist before using salt substitutes.  Cook with plant-based oils, such as canola, sunflower, or olive oil. Meal planning   When eating at a restaurant, ask that your food be prepared with less salt or no salt, if possible.  Avoid foods that contain MSG (monosodium glutamate). MSG is sometimes added to [place] food, bouillon, and some canned foods. What foods are recommended? The items listed may not be a complete list. Talk with your dietitian about what dietary choices are best for you. Grains  Low-sodium cereals, including oats, puffed wheat and rice, and shredded wheat. Low-sodium crackers. Unsalted rice. Unsalted pasta. Low-sodium bread. Whole-grain breads and whole-grain pasta. Vegetables  Fresh or frozen vegetables. "No salt added" canned vegetables. "No salt added" tomato sauce and paste. Low-sodium or reduced-sodium tomato and vegetable juice. Fruits  Fresh, frozen, or canned fruit. Fruit juice. Meats and other protein foods  Fresh or frozen (no salt added) meat, poultry, seafood, and fish. Low-sodium canned tuna and salmon. Unsalted nuts. Dried peas, beans, and lentils without added salt. Unsalted canned beans. Eggs. Unsalted nut butters. Dairy  Milk. Soy milk. Cheese that is naturally low in sodium, such as ricotta cheese, fresh mozzarella, or Swiss cheese Low-sodium or reduced-sodium cheese. Cream cheese. Yogurt. Fats and oils  Unsalted butter. Unsalted margarine with no trans fat. Vegetable oils such as canola or olive oils. Seasonings and other foods  Fresh and dried herbs and spices. Salt-free seasonings. Low-sodium mustard and ketchup. Sodium-free salad dressing. Sodium-free light mayonnaise. Fresh or refrigerated horseradish. Lemon juice. Vinegar. Homemade, reduced-sodium, or low-sodium soups. Unsalted popcorn and pretzels. Low-salt or salt-free chips. What foods are not recommended? The items listed may not be a complete list. Talk with your dietitian about what dietary  choices are best for you. Grains  Instant hot cereals. Bread stuffing, pancake, and biscuit mixes. Croutons. Seasoned rice or pasta mixes. Noodle soup cups. Boxed or frozen macaroni and cheese. Regular salted crackers. Self-rising flour. Vegetables  Sauerkraut, pickled vegetables, and relishes. Olives. [place] fries. Onion rings. Regular canned vegetables (not low-sodium or reduced-sodium). Regular canned tomato sauce and paste (not low-sodium or reduced-sodium). Regular tomato and vegetable juice (not low-sodium or reduced-sodium). Frozen vegetables in sauces. Meats and other protein foods  Meat or fish that is salted, canned, smoked, spiced, or pickled. Bacon, ham, sausage, hotdogs, corned beef, chipped beef, packaged lunch meats, salt pork, jerky, pickled herring, anchovies, regular canned tuna, sardines, salted nuts. Dairy  Processed cheese and cheese spreads. Cheese curds. Blue cheese. Feta cheese. String cheese. Regular cottage cheese. Buttermilk. Canned milk. Fats and oils  Salted butter. Regular margarine. Ghee. Bacon fat. Seasonings and other foods  Onion salt, garlic salt, seasoned salt, table salt, and sea salt. Canned and packaged gravies. Worcestershire sauce. Tartar sauce. Barbecue sauce. Teriyaki sauce. Soy sauce, including reduced-sodium. Steak sauce. Fish sauce. Oyster sauce. Cocktail sauce. Horseradish that you find on the shelf. Regular ketchup and mustard. Meat flavorings and tenderizers. Bouillon cubes. Hot sauce and Tabasco sauce. Premade or packaged marinades. Premade or packaged taco seasonings. Relishes. Regular salad dressings. Salsa. Potato and tortilla chips. Corn chips and puffs. Salted popcorn and pretzels. Canned or dried soups. Pizza. Frozen entrees and pot pies. Summary  Eating  less sodium can help lower your blood pressure, reduce swelling, and protect your heart, liver, and kidneys.  Most people on this plan should limit their sodium intake to 1,500-2,000 mg  (milligrams) of sodium each day.  Canned, boxed, and frozen foods are high in sodium. Restaurant foods, fast foods, and pizza are also very high in sodium. You also get sodium by adding salt to food.  Try to cook at home, eat more fresh fruits and vegetables, and eat less fast food, canned, processed, or prepared foods. This information is not intended to replace advice given to you by your health care provider. Make sure you discuss any questions you have with your health care provider. Document Released: 04/15/2002 Document Revised: 10/17/2016 Document Reviewed: 10/17/2016 Elsevier Interactive Patient Education  2017 Elsevier Inc.   -  Hypertension Hypertension is another name for high blood pressure. High blood pressure forces your heart to work harder to pump blood. This can cause problems over time. There are two numbers in a blood pressure reading. There is a top number (systolic) over a bottom number (diastolic). It is best to have a blood pressure below 120/80. Healthy choices can help lower your blood pressure. You may need medicine to help lower your blood pressure if:  Your blood pressure cannot be lowered with healthy choices.  Your blood pressure is higher than 130/80. Follow these instructions at home: Eating and drinking   If directed, follow the DASH eating plan. This diet includes:  Filling half of your plate at each meal with fruits and vegetables.  Filling one quarter of your plate at each meal with whole grains. Whole grains include whole wheat pasta, brown rice, and whole grain bread.  Eating or drinking low-fat dairy products, such as skim milk or low-fat yogurt.  Filling one quarter of your plate at each meal with low-fat (lean) proteins. Low-fat proteins include fish, skinless chicken, eggs, beans, and tofu.  Avoiding fatty meat, cured and processed meat, or chicken with skin.  Avoiding premade or processed food.  Eat less than 1,500 mg of salt (sodium) a  day.  Limit alcohol use to no more than 1 drink a day for nonpregnant women and 2 drinks a day for men. One drink equals 12 oz of beer, 5 oz of wine, or 1 oz of hard liquor. Lifestyle   Work with your doctor to stay at a healthy weight or to lose weight. Ask your doctor what the best weight is for you.  Get at least 30 minutes of exercise that causes your heart to beat faster (aerobic exercise) most days of the week. This may include walking, swimming, or biking.  Get at least 30 minutes of exercise that strengthens your muscles (resistance exercise) at least 3 days a week. This may include lifting weights or pilates.  Do not use any products that contain nicotine or tobacco. This includes cigarettes and e-cigarettes. If you need help quitting, ask your doctor.  Check your blood pressure at home as told by your doctor.  Keep all follow-up visits as told by your doctor. This is important. Medicines   Take over-the-counter and prescription medicines only as told by your doctor. Follow directions carefully.  Do not skip doses of blood pressure medicine. The medicine does not work as well if you skip doses. Skipping doses also puts you at risk for problems.  Ask your doctor about side effects or reactions to medicines that you should watch for. Contact a doctor if:  You think  you are having a reaction to the medicine you are taking.  You have headaches that keep coming back (recurring).  You feel dizzy.  You have swelling in your ankles.  You have trouble with your vision. Get help right away if:  You get a very bad headache.  You start to feel confused.  You feel weak or numb.  You feel faint.  You get very bad pain in your:  Chest.  Belly (abdomen).  You throw up (vomit) more than once.  You have trouble breathing. Summary  Hypertension is another name for high blood pressure.  Making healthy choices can help lower blood pressure. If your blood pressure cannot  be controlled with healthy choices, you may need to take medicine. This information is not intended to replace advice given to you by your health care provider. Make sure you discuss any questions you have with your health care provider. Document Released: 04/11/2008 Document Revised: 09/21/2016 Document Reviewed: 09/21/2016 Elsevier Interactive Patient Education  2017 [employer].
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
## 2017-01-10 LAB — CERVICOVAGINAL ANCILLARY ONLY
Chlamydia: NEGATIVE
NEISSERIA GONORRHEA: NEGATIVE
Wet Prep (BD Affirm): NEGATIVE

## 2017-01-11 ENCOUNTER — Telehealth: Payer: Self-pay | Admitting: Internal Medicine

## 2017-01-11 LAB — CYTOLOGY - PAP
DIAGNOSIS: NEGATIVE
HPV: DETECTED — AB

## 2017-01-11 MED ORDER — HYDROCHLOROTHIAZIDE 25 MG PO TABS: 25.0000 mg | ORAL_TABLET | Freq: Every day | ORAL | 3 refills | Status: DC

## 2017-01-11 NOTE — Telephone Encounter (Signed)
Patient needs prescription to resend to pharmacy, they did not receive prescription

## 2017-01-11 NOTE — Telephone Encounter (Signed)
Resent script from 01/09/17 to preferred pharmacy [facility])

## 2017-01-12 LAB — PAP, THIN PREP W/HPV RFLX HPV TYPE 16/18

## 2017-01-14 ENCOUNTER — Other Ambulatory Visit: Payer: Self-pay | Admitting: Internal Medicine

## 2017-01-14 MED ORDER — NYSTATIN 100000 UNIT/GM EX POWD: Freq: Four times a day (QID) | CUTANEOUS | 0 refills | Status: DC

## 2017-01-14 MED ORDER — FLUCONAZOLE 150 MG PO TABS: 150.0000 mg | ORAL_TABLET | Freq: Every day | ORAL | 0 refills | Status: DC

## 2017-01-16 LAB — CERVICOVAGINAL ANCILLARY ONLY: Herpes: NEGATIVE

## 2017-01-17 ENCOUNTER — Telehealth: Payer: Self-pay | Admitting: Internal Medicine

## 2017-01-17 NOTE — Telephone Encounter (Signed)
Pt states that script for water pill was never received by [facility] [address] and she would like the script to be sent to our pharmacy instead. Also inquiring about results from a recent pap.Please f/u.

## 2017-01-18 MED ORDER — FLUCONAZOLE 150 MG PO TABS: 150.0000 mg | ORAL_TABLET | Freq: Every day | ORAL | 0 refills | Status: DC

## 2017-01-18 MED ORDER — NYSTATIN 100000 UNIT/GM EX POWD: Freq: Four times a day (QID) | CUTANEOUS | 0 refills | Status: DC

## 2017-01-18 MED ORDER — HYDROCHLOROTHIAZIDE 25 MG PO TABS
25.0000 mg | ORAL_TABLET | Freq: Every day | ORAL | 3 refills | Status: AC
Start: 2017-01-18 — End: ?

## 2017-01-18 MED FILL — NYSTOP 100,000 UNITS/GM PWD: 100000 | 25 days supply | Qty: 15 | Fill #0 | Status: TO

## 2017-01-18 MED FILL — HYDROCHLOROTHIAZIDE 25 MG T: 25 | 30 days supply | Qty: 30 | Fill #0

## 2017-01-18 MED FILL — FLUCONAZOLE 150 MG TABLET: 150 | 1 days supply | Qty: 1 | Fill #0 | Status: TO

## 2017-01-18 NOTE — Telephone Encounter (Signed)
Pt verified name and DOB. Pt aware of results but [facility]

## 2017-01-18 NOTE — Telephone Encounter (Signed)
Left message on VM to return call 

## 2017-01-18 NOTE — Telephone Encounter (Signed)
Called, left VM.  Eustace Penravia - could you call her, since [place].  I sent her hctz to our pharmacy.   Also her recent pap results below:  Herpes negative as well. thanks  Neg pap, no bv or std, noted yeast infection thought. rx diflucan 1 tab x 1, and nystatin powder ordered -- I resent this to our pharmacy as well if [facility]. Repeat pap in 3 years. thanks

## 2017-01-18 NOTE — Telephone Encounter (Signed)
Pt returning call

## 2017-01-25 ENCOUNTER — Telehealth: Payer: Self-pay

## 2017-01-25 NOTE — Telephone Encounter (Signed)
Contacted pt to go over pap results pt states she spoke to a nurse last week about results. Pt states she appreciates the call and she has already taken the medicine and she doesn't have any questions or concerns

## 2017-03-08 ENCOUNTER — Encounter: Payer: Self-pay | Admitting: Physical Medicine & Rehabilitation

## 2017-03-23 ENCOUNTER — Encounter: Payer: Self-pay | Admitting: Internal Medicine

## 2017-03-24 ENCOUNTER — Encounter: Payer: Self-pay | Admitting: Internal Medicine

## 2017-03-27 ENCOUNTER — Encounter: Payer: Self-pay | Admitting: Internal Medicine

## 2017-04-04 MED FILL — HYDROCHLOROTHIAZIDE 25 MG T: 25 | 30 days supply | Qty: 30 | Fill #1

## 2017-04-12 ENCOUNTER — Encounter: Payer: Self-pay | Admitting: Physical Medicine & Rehabilitation

## 2017-04-17 ENCOUNTER — Ambulatory Visit: Payer: Self-pay | Admitting: Family Medicine

## 2017-04-18 ENCOUNTER — Encounter: Payer: Self-pay | Admitting: Internal Medicine

## 2017-04-18 ENCOUNTER — Ambulatory Visit: Payer: Self-pay | Attending: Internal Medicine | Admitting: Internal Medicine

## 2017-04-18 VITALS — BP 129/84 | HR 67 | Temp 98.9°F | Resp 16 | Wt 173.8 lb

## 2017-04-18 DIAGNOSIS — Z9889 Other specified postprocedural states: Secondary | ICD-10-CM | POA: Insufficient documentation

## 2017-04-18 DIAGNOSIS — R197 Diarrhea, unspecified: Secondary | ICD-10-CM | POA: Insufficient documentation

## 2017-04-18 DIAGNOSIS — F1010 Alcohol abuse, uncomplicated: Secondary | ICD-10-CM | POA: Insufficient documentation

## 2017-04-18 DIAGNOSIS — K219 Gastro-esophageal reflux disease without esophagitis: Secondary | ICD-10-CM | POA: Insufficient documentation

## 2017-04-18 DIAGNOSIS — F329 Major depressive disorder, single episode, unspecified: Secondary | ICD-10-CM | POA: Insufficient documentation

## 2017-04-18 DIAGNOSIS — E46 Unspecified protein-calorie malnutrition: Secondary | ICD-10-CM | POA: Insufficient documentation

## 2017-04-18 DIAGNOSIS — Z114 Encounter for screening for human immunodeficiency virus [HIV]: Secondary | ICD-10-CM | POA: Insufficient documentation

## 2017-04-18 DIAGNOSIS — Z113 Encounter for screening for infections with a predominantly sexual mode of transmission: Secondary | ICD-10-CM

## 2017-04-18 DIAGNOSIS — I158 Other secondary hypertension: Secondary | ICD-10-CM | POA: Insufficient documentation

## 2017-04-18 DIAGNOSIS — T402X5A Adverse effect of other opioids, initial encounter: Secondary | ICD-10-CM | POA: Insufficient documentation

## 2017-04-18 DIAGNOSIS — G8918 Other acute postprocedural pain: Secondary | ICD-10-CM | POA: Insufficient documentation

## 2017-04-18 DIAGNOSIS — I10 Essential (primary) hypertension: Secondary | ICD-10-CM | POA: Insufficient documentation

## 2017-04-18 DIAGNOSIS — Z79899 Other long term (current) drug therapy: Secondary | ICD-10-CM | POA: Insufficient documentation

## 2017-04-18 DIAGNOSIS — S066X9D Traumatic subarachnoid hemorrhage with loss of consciousness of unspecified duration, subsequent encounter: Secondary | ICD-10-CM | POA: Insufficient documentation

## 2017-04-18 DIAGNOSIS — R739 Hyperglycemia, unspecified: Secondary | ICD-10-CM | POA: Insufficient documentation

## 2017-04-18 DIAGNOSIS — K5900 Constipation, unspecified: Secondary | ICD-10-CM | POA: Insufficient documentation

## 2017-04-18 DIAGNOSIS — D62 Acute posthemorrhagic anemia: Secondary | ICD-10-CM | POA: Insufficient documentation

## 2017-04-18 DIAGNOSIS — F411 Generalized anxiety disorder: Secondary | ICD-10-CM | POA: Insufficient documentation

## 2017-04-18 DIAGNOSIS — F17200 Nicotine dependence, unspecified, uncomplicated: Secondary | ICD-10-CM | POA: Insufficient documentation

## 2017-04-18 DIAGNOSIS — IMO0002 Reserved for concepts with insufficient information to code with codable children: Secondary | ICD-10-CM

## 2017-04-18 DIAGNOSIS — F419 Anxiety disorder, unspecified: Secondary | ICD-10-CM | POA: Insufficient documentation

## 2017-04-18 DIAGNOSIS — F1099 Alcohol use, unspecified with unspecified alcohol-induced disorder: Secondary | ICD-10-CM

## 2017-04-18 DIAGNOSIS — F1410 Cocaine abuse, uncomplicated: Secondary | ICD-10-CM | POA: Insufficient documentation

## 2017-04-18 MED ORDER — OMEPRAZOLE 20 MG PO CPDR: 20.0000 mg | DELAYED_RELEASE_CAPSULE | Freq: Every day | ORAL | 3 refills | Status: DC

## 2017-04-18 MED ORDER — HYDROXYZINE HCL 25 MG PO TABS: 25.0000 mg | ORAL_TABLET | Freq: Every day | ORAL | 1 refills | Status: DC | PRN

## 2017-04-18 MED ORDER — NALTREXONE HCL 50 MG PO TABS: 50.0000 mg | ORAL_TABLET | Freq: Every day | ORAL | 0 refills | Status: DC

## 2017-04-18 NOTE — Patient Instructions (Signed)
Use hydroxyzine as needed for anxiety. Take naltrexone as discussed to help decrease cravings for alcohol. You should not take any narcotic medications while on this medication. Follow-up with [facility]. I recommend that she'll call and get an appointment as soon as possible.  Naltrexone tablets What is this medicine? NALTREXONE (nal TREX one) helps you to remain free of your dependence on opiate drugs or alcohol. It blocks the 'high' that these substances can give you. This medicine is combined with counseling and support groups. This medicine may be used for other purposes; ask your health care provider or pharmacist if you have questions. COMMON BRAND NAME(S): Depade, ReVia What should I tell my health care provider before I take this medicine? They need to know if you have any of these conditions: -if you have used drugs or alcohol within 7 to 10 days -kidney disease -liver disease, including hepatitis -an unusual or allergic reaction to naltrexone, other medicines, foods, dyes, or preservatives -pregnant or trying to get pregnant -breast-feeding How should I use this medicine? Take this medicine by mouth with a full glass of water. Follow the directions on the prescription label. Do not take this medicine within 7 to 10 days of taking any opioid drugs. Take your medicine at regular intervals. Do not take your medicine more often than directed. Do not stop taking except on your doctor's advice. Talk to your pediatrician regarding the use of this medicine in children. Special care may be needed. Overdosage: If you think you have taken too much of this medicine contact a poison control center or emergency room at once. NOTE: This medicine is only for you. Do not share this medicine with others. What if I miss a dose? If you miss a dose and remember on the same day, take the missed dose. If you do not remember until the next day, ask your doctor or health care  professional about rescheduling your doses. Do not take double or extra doses. What may interact with this medicine? Do not take this medicine with any of the following medications: -any prescription or street opioid drug like codiene, heroin, methadone This medicine may also interact with the following medications: -disulfiram -thioridazine This list may not describe all possible interactions. Give your health care provider a list of all the medicines, herbs, non-prescription drugs, or dietary supplements you use. Also tell them if you smoke, drink alcohol, or use illegal drugs. Some items may interact with your medicine. What should I watch for while using this medicine? Your condition will be monitored carefully while you are receiving this medicine. Visit your doctor or health care professional regularly. For this medicine to be most effective you should attend any counseling or support groups that your doctor or health care professional recommends. Do not try to overcome the effects of the medicine by taking large amounts of narcotics or by drinking large amounts of alcohol. This can cause severe problems including death. Also, you may be more sensitive to lower doses of narcotics after you stop taking this medicine. If you are going to have surgery, tell your doctor or health care professional that you are taking this medicine. Do not treat yourself for coughs, colds, pain, or diarrhea. Ask your doctor or health care professional for advice. Some of the ingredients may interact with this medicine and cause side effects. Wear a medical ID bracelet or chain, and carry a card that describes your disease and details of your medicine and dosage times. You  may get drowsy or dizzy. Do not drive, use machinery, or do anything that needs mental alertness until you know how this medicine affects you. Do not stand or sit up quickly, especially if you are an older patient. This reduces the risk of dizzy or  fainting spells. Alcohol may interfere with the effect of this medicine. Avoid alcoholic drinks. What side effects may I notice from receiving this medicine? Side effects that you should report to your doctor or health care professional as soon as possible: -allergic reactions like skin rash, itching or hives, swelling of the face, lips, or tongue -breathing problems -changes in vision, hearing -confusion -dark urine -depressed mood -diarrhea -fast or irregular heart beat -hallucination, loss of contact with reality -light-colored stools -right upper belly pain -suicidal thoughts or other mood changes -unusually weak or tired -vomiting -yellowing of the eyes or skin Side effects that usually do not require medical attention (report to your doctor or health care professional if they continue or are bothersome): -aches, pains -change in sex drive or performance -feeling anxious -headache -loss of appetite, nausea -runny nose, sinus problems, sneezing -stomach pain -trouble sleeping This list may not describe all possible side effects. Call your doctor for medical advice about side effects. You may report side effects to FDA at 1-800-FDA-1088. Where should I keep my medicine? Keep out of the reach of children. Store at room temperature between 20 and 25 degrees C (68 and 77 degrees F). Throw away any unused medicine after the expiration date. NOTE: This sheet is a summary. It may not cover all possible information. If you have questions about this medicine, talk to your doctor, pharmacist, or health care provider.  2018 Elsevier/Gold Standard (2012-08-16 10:33:18)
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
## 2017-04-19 LAB — COMPREHENSIVE METABOLIC PANEL
A/G RATIO: 1.6 (ref 1.2–2.2)
ALT: 23 IU/L (ref 0–32)
AST: 18 IU/L (ref 0–40)
Albumin: 4.4 g/dL (ref 3.5–5.5)
Alkaline Phosphatase: 75 IU/L (ref 39–117)
BUN/Creatinine Ratio: 15 (ref 9–23)
BUN: 10 mg/dL (ref 6–20)
Bilirubin Total: <0.2 mg/dL (ref 0.0–1.2)
CALCIUM: 8.6 mg/dL — AB (ref 8.7–10.2)
CO2: 25 mmol/L (ref 20–29)
Chloride: 100 mmol/L (ref 96–106)
Creatinine, Ser: 0.66 mg/dL (ref 0.57–1.00)
GFR calc non Af Amer: 117 mL/min/{1.73_m2} (ref >59)
GFR, EST AFRICAN AMERICAN: 135 mL/min/{1.73_m2} (ref >59)
Globulin, Total: 2.7 g/dL (ref 1.5–4.5)
Glucose: 95 mg/dL (ref 65–99)
POTASSIUM: 3.7 mmol/L (ref 3.5–5.2)
Sodium: 137 mmol/L (ref 134–144)
TOTAL PROTEIN: 7.1 g/dL (ref 6.0–8.5)

## 2017-04-19 LAB — HIV ANTIBODY (ROUTINE TESTING W REFLEX): HIV SCREEN 4TH GENERATION: NONREACTIVE

## 2017-04-19 MED FILL — ?HYDROXYZINE HCL 25 MG TAB: 25 MG | 30 days supply | Qty: 30 | Fill #0

## 2017-04-20 LAB — CERVICOVAGINAL ANCILLARY ONLY
BACTERIAL VAGINITIS: NEGATIVE
CANDIDA VAGINITIS: NEGATIVE
CHLAMYDIA, DNA PROBE: NEGATIVE
Neisseria Gonorrhea: NEGATIVE
Trichomonas: NEGATIVE

## 2017-04-24 MED FILL — ?OMEPRAZOLE DR 20 MG CAPSUL: 20 | 30 days supply | Qty: 30 | Fill #0

## 2017-04-26 ENCOUNTER — Telehealth: Payer: Self-pay

## 2017-04-26 NOTE — Telephone Encounter (Signed)
Contacted pt to go over lab and pap results pt didn't answer lvm asking pt to give me a call at her earliest convenience   If pt calls back please give results: HIV test is negative. Kidney and liver function test normal. Ok to start Naltrexone. vaginal specimen was negative for Chlamydia gonorrhea and Trichomonas..Marland Kitchen

## 2017-04-26 NOTE — Telephone Encounter (Signed)
Pt returned call and is aware of results and doesn't have any questions or concerns 

## 2017-05-17 ENCOUNTER — Encounter: Payer: Self-pay | Admitting: Physical Medicine & Rehabilitation

## 2017-06-07 ENCOUNTER — Encounter: Payer: Self-pay | Admitting: Physical Medicine & Rehabilitation

## 2017-06-08 MED FILL — HYDROCHLOROTHIAZIDE 25 MG T: 25 | 30 days supply | Qty: 30 | Fill #2

## 2017-06-08 MED FILL — ?OMEPRAZOLE DR 20 MG CAPSUL: 20 | 30 days supply | Qty: 30 | Fill #1

## 2017-07-18 ENCOUNTER — Ambulatory Visit: Payer: Self-pay | Admitting: Internal Medicine

## 2017-07-20 ENCOUNTER — Ambulatory Visit: Payer: Self-pay | Admitting: Internal Medicine

## 2017-10-02 ENCOUNTER — Other Ambulatory Visit: Payer: Self-pay | Admitting: Internal Medicine

## 2017-10-02 DIAGNOSIS — K219 Gastro-esophageal reflux disease without esophagitis: Secondary | ICD-10-CM

## 2017-11-06 ENCOUNTER — Other Ambulatory Visit: Payer: Self-pay | Admitting: Internal Medicine

## 2017-11-06 DIAGNOSIS — K219 Gastro-esophageal reflux disease without esophagitis: Secondary | ICD-10-CM
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
## 2021-03-18 ENCOUNTER — Emergency Department (HOSPITAL_COMMUNITY)
Admission: EM | Admit: 2021-03-18 | Discharge: 2021-03-18 | Disposition: A | Discharge status: Home / Self Care | Payer: Self-pay | Attending: Emergency Medicine | Admitting: Emergency Medicine

## 2021-03-18 ENCOUNTER — Other Ambulatory Visit: Payer: Self-pay

## 2021-03-18 ENCOUNTER — Encounter (HOSPITAL_COMMUNITY): Payer: Self-pay

## 2021-03-18 DIAGNOSIS — R112 Nausea with vomiting, unspecified: Secondary | ICD-10-CM | POA: Insufficient documentation

## 2021-03-18 DIAGNOSIS — R531 Weakness: Secondary | ICD-10-CM | POA: Insufficient documentation

## 2021-03-18 DIAGNOSIS — F10121 Alcohol abuse with intoxication delirium: Secondary | ICD-10-CM | POA: Insufficient documentation

## 2021-03-18 DIAGNOSIS — I10 Essential (primary) hypertension: Secondary | ICD-10-CM | POA: Insufficient documentation

## 2021-03-18 DIAGNOSIS — Z79899 Other long term (current) drug therapy: Secondary | ICD-10-CM | POA: Insufficient documentation

## 2021-03-18 DIAGNOSIS — F10921 Alcohol use, unspecified with intoxication delirium: Secondary | ICD-10-CM

## 2021-03-18 DIAGNOSIS — F17200 Nicotine dependence, unspecified, uncomplicated: Secondary | ICD-10-CM | POA: Insufficient documentation

## 2021-03-18 LAB — COMPREHENSIVE METABOLIC PANEL
ALT: 28 U/L (ref 0–44)
AST: 30 U/L (ref 15–41)
Albumin: 3.6 g/dL (ref 3.5–5.0)
Alkaline Phosphatase: 40 U/L (ref 38–126)
Anion gap: 8 (ref 5–15)
BUN: <5 mg/dL — ABNORMAL LOW (ref 6–20)
CO2: 21 mmol/L — ABNORMAL LOW (ref 22–32)
Calcium: 7.2 mg/dL — ABNORMAL LOW (ref 8.9–10.3)
Chloride: 116 mmol/L — ABNORMAL HIGH (ref 98–111)
Creatinine, Ser: 0.62 mg/dL (ref 0.44–1.00)
GFR, Estimated: >60 mL/min (ref >60)
Glucose, Bld: 122 mg/dL — ABNORMAL HIGH (ref 70–99)
Potassium: 3.2 mmol/L — ABNORMAL LOW (ref 3.5–5.1)
Sodium: 145 mmol/L (ref 135–145)
Total Bilirubin: 0.5 mg/dL (ref 0.3–1.2)
Total Protein: 6.1 g/dL — ABNORMAL LOW (ref 6.5–8.1)

## 2021-03-18 LAB — CBC WITH DIFFERENTIAL/PLATELET
Abs Immature Granulocytes: 0.01 10*3/uL (ref 0.00–0.07)
Basophils Absolute: 0 10*3/uL (ref 0.0–0.1)
Basophils Relative: 1 %
Eosinophils Absolute: 0 10*3/uL (ref 0.0–0.5)
Eosinophils Relative: 0 %
HCT: 39.2 % (ref 36.0–46.0)
Hemoglobin: 13 g/dL (ref 12.0–15.0)
Immature Granulocytes: 0 %
Lymphocytes Relative: 16 %
Lymphs Abs: 0.7 10*3/uL (ref 0.7–4.0)
MCH: 31.4 pg (ref 26.0–34.0)
MCHC: 33.2 g/dL (ref 30.0–36.0)
MCV: 94.7 fL (ref 80.0–100.0)
Monocytes Absolute: 0.1 10*3/uL (ref 0.1–1.0)
Monocytes Relative: 3 %
Neutro Abs: 3.7 10*3/uL (ref 1.7–7.7)
Neutrophils Relative %: 80 %
Platelets: 316 10*3/uL (ref 150–400)
RBC: 4.14 MIL/uL (ref 3.87–5.11)
RDW: 12 % (ref 11.5–15.5)
WBC: 4.6 10*3/uL (ref 4.0–10.5)
nRBC: 0 % (ref 0.0–0.2)

## 2021-03-18 LAB — I-STAT BETA HCG BLOOD, ED (MC, WL, AP ONLY): I-stat hCG, quantitative: <5 m[IU]/mL (ref <5)

## 2021-03-18 MED ORDER — CHLORDIAZEPOXIDE HCL 25 MG PO CAPS: ORAL_CAPSULE | ORAL | 0 refills | Status: DC

## 2021-03-18 MED ORDER — LORAZEPAM 2 MG/ML IJ SOLN
1.0000 mg | Freq: Once | INTRAMUSCULAR | Status: AC
  Administered 2021-03-18: 1 mg via INTRAVENOUS
  Filled 2021-03-18: qty 1

## 2021-03-18 MED ORDER — CHLORDIAZEPOXIDE HCL 25 MG PO CAPS
25.0000 mg | ORAL_CAPSULE | Freq: Once | ORAL | Status: AC
  Administered 2021-03-18: 25 mg via ORAL
  Filled 2021-03-18: qty 1

## 2021-03-18 MED ORDER — KETOROLAC TROMETHAMINE 30 MG/ML IJ SOLN
30.0000 mg | Freq: Once | INTRAMUSCULAR | Status: AC
  Administered 2021-03-18: 30 mg via INTRAVENOUS
  Filled 2021-03-18: qty 1

## 2021-03-18 MED ORDER — SODIUM CHLORIDE 0.9 % IV BOLUS
1000.0000 mL | Freq: Once | INTRAVENOUS | Status: AC
  Administered 2021-03-18: 1000 mL via INTRAVENOUS

## 2021-03-18 NOTE — ED Notes (Signed)
Pt ambulatory to the bathroom 
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
## 2021-03-18 NOTE — ED Notes (Signed)
Pt ambulatory to the bathroom with no problems

## 2021-03-18 NOTE — Discharge Instructions (Addendum)
Please obtain and take the provided medication, this should decrease the likelihood of complications of alcohol abuse.  Follow-up with your physician, or use the provided resources for additional assistance.  Return here for concerning changes in your condition.
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
## 2021-05-21 ENCOUNTER — Encounter (HOSPITAL_COMMUNITY): Payer: Self-pay | Admitting: Emergency Medicine

## 2021-05-21 ENCOUNTER — Emergency Department (HOSPITAL_COMMUNITY)
Admission: EM | Admit: 2021-05-21 | Discharge: 2021-05-21 | Disposition: A | Discharge status: Home / Self Care | Payer: Self-pay | Attending: Emergency Medicine | Admitting: Emergency Medicine

## 2021-05-21 DIAGNOSIS — F17200 Nicotine dependence, unspecified, uncomplicated: Secondary | ICD-10-CM | POA: Insufficient documentation

## 2021-05-21 DIAGNOSIS — M549 Dorsalgia, unspecified: Secondary | ICD-10-CM | POA: Insufficient documentation

## 2021-05-21 DIAGNOSIS — I10 Essential (primary) hypertension: Secondary | ICD-10-CM | POA: Insufficient documentation

## 2021-05-21 DIAGNOSIS — K219 Gastro-esophageal reflux disease without esophagitis: Secondary | ICD-10-CM | POA: Insufficient documentation

## 2021-05-21 DIAGNOSIS — M791 Myalgia, unspecified site: Secondary | ICD-10-CM | POA: Insufficient documentation

## 2021-05-21 DIAGNOSIS — R1084 Generalized abdominal pain: Secondary | ICD-10-CM | POA: Insufficient documentation

## 2021-05-21 DIAGNOSIS — F1193 Opioid use, unspecified with withdrawal: Secondary | ICD-10-CM

## 2021-05-21 DIAGNOSIS — F1123 Opioid dependence with withdrawal: Secondary | ICD-10-CM | POA: Insufficient documentation

## 2021-05-21 DIAGNOSIS — Z79899 Other long term (current) drug therapy: Secondary | ICD-10-CM | POA: Insufficient documentation
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
## 2021-05-21 NOTE — ED Notes (Signed)
Pt evaluated by PA on triage and dc from the ED with resources for detox center.

## 2021-05-21 NOTE — Discharge Instructions (Addendum)
Please follow-up with your primary care provider to have your blood pressure reassessed as it was elevated here in the emergency department.  Get help right away if: You have a seizure. You lose consciousness. You cannot stop vomiting. You are unable to drink or eat, and you become weak and dizzy. You develop chest pain, shortness of breath, or fever. You have serious thoughts about hurting yourself or others.

## 2021-05-21 NOTE — ED Triage Notes (Signed)
Pt brought to ED by [facility] from home for c/o possible Fentanyl withdraw pt was taking 150 mcg Fentanyl for the past 4 years and stopped taking it few days ago. Pt states she took some Xanax prior to arrive to ED, having constant abd pain and back pain.  BP 148/80 HR 98 R 18 SPO2 98% RA CBG 135

## 2021-06-21 ENCOUNTER — Other Ambulatory Visit: Payer: Self-pay

## 2021-06-21 ENCOUNTER — Ambulatory Visit: Payer: Self-pay | Admitting: Physician Assistant

## 2021-06-21 VITALS — BP 149/82 | HR 86 | Temp 98.2°F | Resp 18 | Ht 65.0 in | Wt 138.0 lb

## 2021-06-21 DIAGNOSIS — Z9189 Other specified personal risk factors, not elsewhere classified: Secondary | ICD-10-CM

## 2021-06-21 DIAGNOSIS — I10 Essential (primary) hypertension: Secondary | ICD-10-CM | POA: Insufficient documentation

## 2021-06-21 DIAGNOSIS — F3289 Other specified depressive episodes: Secondary | ICD-10-CM

## 2021-06-21 DIAGNOSIS — F1410 Cocaine abuse, uncomplicated: Secondary | ICD-10-CM

## 2021-06-21 DIAGNOSIS — Z1159 Encounter for screening for other viral diseases: Secondary | ICD-10-CM

## 2021-06-21 DIAGNOSIS — F1010 Alcohol abuse, uncomplicated: Secondary | ICD-10-CM

## 2021-06-21 DIAGNOSIS — Z6822 Body mass index (BMI) 22.0-22.9, adult: Secondary | ICD-10-CM

## 2021-06-21 MED ORDER — SERTRALINE HCL 25 MG PO TABS: 25.0000 mg | ORAL_TABLET | Freq: Every day | ORAL | 1 refills | Status: AC

## 2021-06-21 NOTE — Progress Notes (Signed)
Patient has eaten today and taken zoloft and ibuprofen. Patient denies pain at this time. Patient request refills

## 2021-06-21 NOTE — Patient Instructions (Signed)
I do encourage you to check your blood pressure on a daily basis, keep a written log and have available for all office visits.  Please report any elevated blood pressure readings.  I encourage you to increase your hydration, increase your hours of sleep and physical activity.  We will call you with today's lab results.  Roney Jaffe, PA-C Physician Assistant [facility] [email or web address]   How to Take Your Blood Pressure Blood pressure is a measurement of how strongly your blood is pressing against the walls of your arteries. Arteries are blood vessels that carry blood from your heart throughout your body. Your health care provider takes your blood pressure at each office visit. You can also take your own blood pressure athome with a blood pressure monitor. You may need to take your own blood pressure to: Confirm a diagnosis of high blood pressure (hypertension). Monitor your blood pressure over time. Make sure your blood pressure medicine is working. Supplies needed: Blood pressure monitor. Dining room chair to sit in. Table or desk. Small notebook and pencil or pen. How to prepare To get the most accurate reading, avoid the following for 30 minutes before you check your blood pressure: Drinking caffeine. Drinking alcohol. Eating. Smoking. Exercising. Five minutes before you check your blood pressure: Use the bathroom and urinate so that you have an empty bladder. Sit quietly in a dining room chair. Do not sit in a soft couch or an armchair. Do not talk. How to take your blood pressure To check your blood pressure, follow the instructions in the manual that came with your blood pressure monitor. If you have a digital blood pressure monitor, the instructions may be as follows: Sit up straight in a chair. Place your feet on the floor. Do not cross your ankles or legs. Rest your left arm at the level of your heart on a  table or desk or on the arm of a chair. Pull up your shirt sleeve. Wrap the blood pressure cuff around the upper part of your left arm, 1 inch (2.5 cm) above your elbow. It is best to wrap the cuff around bare skin. Fit the cuff snugly around your arm. You should be able to place only one finger between the cuff and your arm. Position the cord so that it rests in the bend of your elbow. Press the power button. Sit quietly while the cuff inflates and deflates. Read the digital reading on the monitor screen and write the numbers down (record them) in a notebook. Wait 2-3 minutes, then repeat the steps, starting at step 1. What does my blood pressure reading mean? A blood pressure reading consists of a higher number over a lower number. Ideally, your blood pressure should be below 120/80. The first ("top") number is called the systolic pressure. It is a measure of the pressure in your arteries as your heart beats. The second ("bottom") number is called the diastolic pressure. It is a measure of the pressure in your arteries as theheart relaxes. Blood pressure is classified into five stages. The following are the stages for adults who do not have a short-term serious illness or a chronic condition. Systolic pressure and diastolic pressure are measured in a unit called mm Hg (millimeters of mercury). Normal Systolic pressure: below 120. Diastolic pressure: below 80. Elevated Systolic pressure: 120-129. Diastolic pressure: below 80. Hypertension stage 1 Systolic pressure: 130-139. Diastolic pressure: 80-89. Hypertension stage 2 Systolic pressure: 140 or above. Diastolic pressure: 90 or above. You  can have elevated blood pressure or hypertension even if only the systolicor only the diastolic number in your reading is higher than normal. Follow these instructions at home: Medicines Take over-the-counter and prescription medicines only as told by your health care provider. Tell your health care  provider if you are having any side effects from blood pressure medicine. General instructions Check your blood pressure as often as recommended by your health care provider. Check your blood pressure at the same time every day. Take your monitor to the next appointment with your health care provider to make sure that: You are using it correctly. It provides accurate readings. Understand what your goal blood pressure numbers are. Keep all follow-up visits as told by your health care provider. This is important. General tips Your health care provider can suggest a reliable monitor that will meet your needs. There are several types of home blood pressure monitors. Choose a monitor that has an arm cuff. Do not choose a monitor that measures your blood pressure from your wrist or finger. Choose a cuff that wraps snugly around your upper arm. You should be able to fit only one finger between your arm and the cuff. You can buy a blood pressure monitor at most drugstores or online. Where to find more information American Heart Association: www.heart.org Contact a health care provider if: Your blood pressure is consistently high. Your blood pressure is suddenly low. Get help right away if: Your systolic blood pressure is higher than 180. Your diastolic blood pressure is higher than 120. Summary Blood pressure is a measurement of how strongly your blood is pressing against the walls of your arteries. A blood pressure reading consists of a higher number over a lower number. Ideally, your blood pressure should be below 120/80. Check your blood pressure at the same time every day. Avoid caffeine, alcohol, smoking, and exercise for 30 minutes prior to checking your blood pressure. These agents can affect the accuracy of the blood pressure reading. This information is not intended to replace advice given to you by your health care provider. Make sure you discuss any questions you have with your  healthcare provider. Document Revised: 09/02/2020 Document Reviewed: 10/18/2019 Elsevier Patient Education  2022 [employer].

## 2021-06-21 NOTE — Progress Notes (Signed)
Established Patient Office Visit  Subjective:  Patient ID: Toni Andrews, female    DOB: Aug 11, 1986  Age: 35 y.o. MRN: [id number]  CC:  Chief Complaint  Patient presents with   Depression     HPI Toni Andrews states that she is currently being treated at [facility] residential for substance abuse treatment.  Reports that she has been sober for 31 days, has been at [facility] for the past 3 weeks.  Reports that her plans are to go to a further treatment center in either [place], [place] or [place].  States that she is unsure if she will return to the [place] area.  Reports that she feels her moods are good overall.  Is taking 25 mg of Zoloft daily.  Reports that her sleep is good, states that she is using melatonin with relief, does state that she does tend to stay up a little bit later but is sleeping approximately 5-1/2 hours a night.  Reports that she was previously treated for hypertension, states that she took hydrochlorothiazide 25 mg, states that she has not taken this medication in the last year.  Has not been checking blood pressure at home.  Reports that she is currently working on improving her lifestyle with increasing hydration, exercise and nutrition.  States that she does not want to restart medication at this time  Reports that she no longer uses medication for GI issues, states that she has not had any GERD symptoms.       Past Medical History:  Diagnosis Date   Displaced spiral fracture of shaft of right tibia, initial encounter for closed fracture 12/03/2016   Fracture of right tibial plafond without involvement of fibula 12/03/2016   Hypertension     Past Surgical History:  Procedure Laterality Date   ORIF ANKLE FRACTURE Right 12/02/2016   Procedure: OPEN REDUCTION INTERNAL FIXATION (ORIF) ANKLE FRACTURE;  Surgeon: Altamese Monroe, MD;  Location: [place];  Service: Orthopedics;  Laterality: Right;   TIBIA IM NAIL INSERTION Right 12/02/2016   Procedure:  INTRAMEDULLARY (IM) NAIL TIBIAL;  Surgeon: Altamese , MD;  Location: [place];  Service: Orthopedics;  Laterality: Right;    History reviewed. No pertinent family history.  Social History   Socioeconomic History   Marital status: Legally Separated    Spouse name: Not on file   Number of children: Not on file   Years of education: Not on file   Highest education level: Not on file  Occupational History   Not on file  Tobacco Use   Smoking status: Former    Types: Cigarettes   Smokeless tobacco: Never  Substance and Sexual Activity   Alcohol use: Yes    Comment: 05/21/21   Drug use: Yes    Types: Cocaine    Comment: 5 -6 months   Sexual activity: Not Currently  Other Topics Concern   Not on file  Social History Narrative   Not on file   Social Determinants of Health   Financial Resource Strain: Not on file  Food Insecurity: Not on file  Transportation Needs: Not on file  Physical Activity: Not on file  Stress: Not on file  Social Connections: Not on file  Intimate Partner Violence: Not on file    Outpatient Medications Prior to Visit  Medication Sig Dispense Refill   Melatonin 10 MG TABS Take 5 mg by mouth.     ibuprofen (ADVIL) 400 MG tablet Take 400 mg by mouth every 6 (six) hours as needed.  sertraline (ZOLOFT) 25 MG tablet Take 25 mg by mouth daily.     hydrochlorothiazide (HYDRODIURIL) 25 MG tablet Take 1 tablet (25 mg total) by mouth daily. (Patient not taking: Reported on 06/21/2021) 90 tablet 3   chlordiazePOXIDE (LIBRIUM) 25 MG capsule 2m PO TID x 1D, then 25-592mPO BID X 1D, then 25-5035mO QD X 1D 10 capsule 0   hydrOXYzine (ATARAX/VISTARIL) 25 MG tablet Take 1 tablet (25 mg total) by mouth daily as needed for anxiety. (Patient not taking: Reported on 06/21/2021) 30 tablet 1   Multiple Vitamin (MULTIVITAMIN WITH MINERALS) TABS tablet Take 1 tablet by mouth daily. (Patient not taking: Reported on 06/21/2021)     naltrexone (DEPADE) 50 MG tablet Take 1  tablet (50 mg total) by mouth daily. 30 tablet 0   omeprazole (PRILOSEC) 20 MG capsule TAKE 1 CAPSULE BY MOUTH DAILY (Patient not taking: Reported on 06/21/2021) 30 capsule 0   polyethylene glycol (MIRALAX / GLYCOLAX) packet Take 17 g by mouth daily. (Patient not taking: No sig reported) 14 each 0   No facility-administered medications prior to visit.    No Known Allergies  ROS Review of Systems  Constitutional: Negative.   HENT: Negative.    Eyes: Negative.   Respiratory:  Negative for shortness of breath.   Cardiovascular:  Negative for chest pain.  Gastrointestinal: Negative.   Endocrine: Negative.   Genitourinary: Negative.   Musculoskeletal: Negative.   Skin: Negative.   Allergic/Immunologic: Negative.   Neurological: Negative.   Hematological: Negative.   Psychiatric/Behavioral:  Negative for dysphoric mood, self-injury, sleep disturbance and suicidal ideas. The patient is not nervous/anxious.      Objective:    Physical Exam Vitals and nursing note reviewed.  Constitutional:      Appearance: Normal appearance.  HENT:     Head: Normocephalic and atraumatic.     Right Ear: External ear normal.     Left Ear: External ear normal.     Nose: Nose normal.     Mouth/Throat:     Mouth: Mucous membranes are moist.  Eyes:     Extraocular Movements: Extraocular movements intact.     Conjunctiva/sclera: Conjunctivae normal.     Pupils: Pupils are equal, round, and reactive to light.  Cardiovascular:     Rate and Rhythm: Normal rate and regular rhythm.     Pulses: Normal pulses.     Heart sounds: Normal heart sounds.  Pulmonary:     Effort: Pulmonary effort is normal.     Breath sounds: Normal breath sounds.  Musculoskeletal:        General: Normal range of motion.     Cervical back: Normal range of motion and neck supple.  Skin:    General: Skin is warm and dry.  Neurological:     General: No focal deficit present.     Mental Status: She is alert and oriented to  person, place, and time.  Psychiatric:        Mood and Affect: Mood normal.        Behavior: Behavior normal.        Thought Content: Thought content normal.        Judgment: Judgment normal.    BP (!) 149/82 (BP Location: Left Arm, Patient Position: Sitting, Cuff Size: Normal)   Pulse 86   Temp 98.2 F (36.8 C) (Oral)   Resp 18   Ht '5\' 5"'  (1.651 m)   Wt 138 lb (62.6 kg)   LMP 06/01/2021  SpO2 100%   BMI 22.96 kg/m  Wt Readings from Last 3 Encounters:  06/21/21 138 lb (62.6 kg)  05/21/21 160 lb 0.9 oz (72.6 kg)  03/18/21 160 lb (72.6 kg)     Health Maintenance Due  Topic Date Due   COVID-19 Vaccine (1) Never done   Pneumococcal Vaccine 77-2 Years old (1 - PCV) Never done   PAP SMEAR-Modifier  01/10/2020   INFLUENZA VACCINE  06/07/2021    There are no preventive care reminders to display for this patient.  Lab Results  Component Value Date   TSH 0.888 06/21/2021   Lab Results  Component Value Date   WBC 4.6 03/18/2021   HGB 13.0 03/18/2021   HCT 39.2 03/18/2021   MCV 94.7 03/18/2021   PLT 316 03/18/2021   Lab Results  Component Value Date   NA 141 06/21/2021   K 3.4 (L) 06/21/2021   CO2 21 (L) 03/18/2021   GLUCOSE 104 (H) 06/21/2021   BUN 9 06/21/2021   CREATININE 0.60 06/21/2021   BILITOT 0.3 06/21/2021   ALKPHOS 63 06/21/2021   AST 13 06/21/2021   ALT 28 03/18/2021   PROT 6.3 06/21/2021   ALBUMIN 4.7 06/21/2021   CALCIUM 8.6 (L) 06/21/2021   ANIONGAP 8 03/18/2021   EGFR 119 06/21/2021   No results found for: CHOL No results found for: HDL No results found for: LDLCALC No results found for: TRIG No results found for: CHOLHDL Lab Results  Component Value Date   HGBA1C 4.9 12/09/2016      Assessment & Plan:   Problem List Items Addressed This Visit       Cardiovascular and Mediastinum   Essential hypertension - Primary   Relevant Orders   Comp. Metabolic Panel (12) (Completed)   TSH (Completed)     Other   Cocaine abuse (HCC)    Depression   Relevant Medications   sertraline (ZOLOFT) 25 MG tablet   Other Relevant Orders   Vitamin D, 25-hydroxy (Completed)   Alcohol abuse   Other Visit Diagnoses     Encounter for HCV screening test for high risk patient       Relevant Orders   HCV Ab w Reflex to Quant PCR (Completed)   BMI 22.0-22.9, adult           Meds ordered this encounter  Medications   sertraline (ZOLOFT) 25 MG tablet    Sig: Take 1 tablet (25 mg total) by mouth daily.    Dispense:  30 tablet    Refill:  1    Order Specific Question:   Supervising Provider    Answer:   WRIGHT, PATRICK E [[id number]]   1. Essential hypertension Encouraged patient to check blood pressure at home on a daily basis, keep a written log and have available for all office visits.  Patient does not want to restart medication at this time wants to work on lifestyle modifications.  Patient encouraged to increase hydration, physical activity, and follow a low-sodium diet.  Red flags given for prompt reevaluation. - Comp. Metabolic Panel (12) - TSH  2. Other depression Continue Zoloft 25 mg - Vitamin D, 25-hydroxy - sertraline (ZOLOFT) 25 MG tablet; Take 1 tablet (25 mg total) by mouth daily.  Dispense: 30 tablet; Refill: 1  3. Cocaine abuse [facility]) Patient currently in treatment at [facility] residential treatment center  4. Alcohol abuse Patient currently treated at [facility] residential treatment center  5. Encounter for HCV screening test for high risk patient  -  HCV Ab w Reflex to Quant PCR   I have reviewed the patient's medical history (PMH, PSH, Social History, Family History, Medications, and allergies) , and have been updated if relevant. I spent 30 minutes reviewing chart and  face to face time with patient.    Follow-up: Return if symptoms worsen or fail to improve.    Loraine Grip Mayers, PA-C

## 2021-06-22 LAB — COMP. METABOLIC PANEL (12)
AST: 13 IU/L (ref 0–40)
Albumin/Globulin Ratio: 2.9 — ABNORMAL HIGH (ref 1.2–2.2)
Albumin: 4.7 g/dL (ref 3.8–4.8)
Alkaline Phosphatase: 63 IU/L (ref 44–121)
BUN/Creatinine Ratio: 15 (ref 9–23)
BUN: 9 mg/dL (ref 6–20)
Bilirubin Total: 0.3 mg/dL (ref 0.0–1.2)
Calcium: 8.6 mg/dL — ABNORMAL LOW (ref 8.7–10.2)
Chloride: 104 mmol/L (ref 96–106)
Creatinine, Ser: 0.6 mg/dL (ref 0.57–1.00)
Globulin, Total: 1.6 g/dL (ref 1.5–4.5)
Glucose: 104 mg/dL — ABNORMAL HIGH (ref 65–99)
Potassium: 3.4 mmol/L — ABNORMAL LOW (ref 3.5–5.2)
Sodium: 141 mmol/L (ref 134–144)
Total Protein: 6.3 g/dL (ref 6.0–8.5)
eGFR: 119 mL/min/{1.73_m2} (ref >59)

## 2021-06-22 LAB — VITAMIN D 25 HYDROXY (VIT D DEFICIENCY, FRACTURES): Vit D, 25-Hydroxy: 17.8 ng/mL — ABNORMAL LOW (ref 30.0–100.0)

## 2021-06-22 LAB — TSH: TSH: 0.888 u[IU]/mL (ref 0.450–4.500)

## 2021-06-22 LAB — HCV AB W REFLEX TO QUANT PCR: HCV Ab: <0.1 s/co ratio (ref 0.0–0.9)

## 2021-06-22 LAB — HCV INTERPRETATION

## 2021-06-24 ENCOUNTER — Telehealth: Payer: Self-pay | Admitting: *Deleted

## 2021-06-24 NOTE — Telephone Encounter (Signed)
-----   Message from Roney Jaffe, [place] sent at 06/22/2021 11:57 AM EDT ----- Please call patient and let her know that her thyroid function, liver function and kidney function are within normal limits.  Her screening for hepatitis C was negative . her potassium and calcium levels are slightly below normal limits, she should eat a diet rich in potassium and calcium and take a daily multivitamin.  Her vitamin D is also low, in addition to the daily multivitamin she should have 2000 international units of vitamin D once daily.  She should have these levels rechecked in approximately 12 to 16 weeks.

## 2021-06-24 NOTE — Telephone Encounter (Signed)
RN is aware of patients results and has began patients medications and aware of planning FU visit. 

## 2021-07-05 ENCOUNTER — Ambulatory Visit: Payer: Self-pay | Admitting: Physician Assistant

## 2021-07-05 ENCOUNTER — Other Ambulatory Visit: Payer: Self-pay

## 2021-07-05 VITALS — BP 137/84 | HR 84 | Temp 98.5°F | Resp 18 | Ht 65.0 in | Wt 144.0 lb

## 2021-07-05 DIAGNOSIS — E559 Vitamin D deficiency, unspecified: Secondary | ICD-10-CM

## 2021-07-05 DIAGNOSIS — B354 Tinea corporis: Secondary | ICD-10-CM

## 2021-07-05 DIAGNOSIS — F1010 Alcohol abuse, uncomplicated: Secondary | ICD-10-CM

## 2021-07-05 DIAGNOSIS — I10 Essential (primary) hypertension: Secondary | ICD-10-CM

## 2021-07-05 MED ORDER — CLOTRIMAZOLE-BETAMETHASONE 1-0.05 % EX CREA: 1.0000 "application " | TOPICAL_CREAM | Freq: Every day | CUTANEOUS | 0 refills | Status: AC

## 2021-07-05 MED ORDER — LISINOPRIL 5 MG PO TABS: 5.0000 mg | ORAL_TABLET | Freq: Every day | ORAL | 1 refills | Status: AC

## 2021-07-05 NOTE — Progress Notes (Signed)
Established Patient Office Visit  Subjective:  Patient ID: Toni Andrews, female    DOB: 1986/04/08  Age: 35 y.o. MRN: [id number]  CC:  Chief Complaint  Patient presents with   Rash     HPI Toni Andrews reports that she has a rash on her right lower back, does state that it is itchy at times.  Reports that it has been there for the past 2 weeks.  Has not tried anything for relief.  Reports that she has been checking her blood pressure on a daily basis and does report elevated readings, readings 140/82 150/90.  Does endorse significant family history of hypertension.  States that she still has not started working on lifestyle modifications due to food choices at the treatment facility she is residing at.  Reports that she has previously taken hydrochlorothiazide, states that she would prefer not to take a diuretic at this time.  Reports that she will be transferring to a long-term treatment center in [facility] in approximately 2 weeks.       Past Medical History:  Diagnosis Date   Displaced spiral fracture of shaft of right tibia, initial encounter for closed fracture 12/03/2016   Fracture of right tibial plafond without involvement of fibula 12/03/2016   Hypertension     Past Surgical History:  Procedure Laterality Date   ORIF ANKLE FRACTURE Right 12/02/2016   Procedure: OPEN REDUCTION INTERNAL FIXATION (ORIF) ANKLE FRACTURE;  Surgeon: Altamese Benld, MD;  Location: [place];  Service: Orthopedics;  Laterality: Right;   TIBIA IM NAIL INSERTION Right 12/02/2016   Procedure: INTRAMEDULLARY (IM) NAIL TIBIAL;  Surgeon: Altamese , MD;  Location: [place];  Service: Orthopedics;  Laterality: Right;    History reviewed. No pertinent family history.  Social History   Socioeconomic History   Marital status: Legally Separated    Spouse name: Not on file   Number of children: Not on file   Years of education: Not on file   Highest education level: Not on file   Occupational History   Not on file  Tobacco Use   Smoking status: Former    Types: Cigarettes   Smokeless tobacco: Never  Substance and Sexual Activity   Alcohol use: Yes    Comment: 05/21/21   Drug use: Yes    Types: Cocaine    Comment: 5 -6 months   Sexual activity: Not Currently  Other Topics Concern   Not on file  Social History Narrative   Not on file   Social Determinants of Health   Financial Resource Strain: Not on file  Food Insecurity: Not on file  Transportation Needs: Not on file  Physical Activity: Not on file  Stress: Not on file  Social Connections: Not on file  Intimate Partner Violence: Not on file    Outpatient Medications Prior to Visit  Medication Sig Dispense Refill   ibuprofen (ADVIL) 200 MG tablet Take 200 mg by mouth every 6 (six) hours as needed.     Melatonin 10 MG TABS Take 5 mg by mouth.     sertraline (ZOLOFT) 25 MG tablet Take 1 tablet (25 mg total) by mouth daily. 30 tablet 1   hydrochlorothiazide (HYDRODIURIL) 25 MG tablet Take 1 tablet (25 mg total) by mouth daily. (Patient not taking: No sig reported) 90 tablet 3   No facility-administered medications prior to visit.    No Known Allergies  ROS Review of Systems  Constitutional: Negative.   HENT: Negative.    Eyes: Negative.  Respiratory:  Negative for shortness of breath.   Cardiovascular:  Negative for chest pain.  Gastrointestinal: Negative.   Endocrine: Negative.   Genitourinary: Negative.   Musculoskeletal: Negative.   Skin:  Positive for rash.  Allergic/Immunologic: Negative.   Neurological: Negative.   Hematological: Negative.   Psychiatric/Behavioral: Negative.       Objective:    Physical Exam Vitals and nursing note reviewed.  Constitutional:      Appearance: Normal appearance.  HENT:     Head: Normocephalic and atraumatic.     Right Ear: External ear normal.     Left Ear: External ear normal.     Nose: Nose normal.     Mouth/Throat:     Mouth: Mucous  membranes are moist.     Pharynx: Oropharynx is clear.  Eyes:     Extraocular Movements: Extraocular movements intact.     Conjunctiva/sclera: Conjunctivae normal.     Pupils: Pupils are equal, round, and reactive to light.  Cardiovascular:     Rate and Rhythm: Normal rate and regular rhythm.     Pulses: Normal pulses.     Heart sounds: Normal heart sounds.  Pulmonary:     Effort: Pulmonary effort is normal.     Breath sounds: Normal breath sounds.  Musculoskeletal:        General: Normal range of motion.     Cervical back: Normal range of motion and neck supple.  Skin:    General: Skin is warm and dry.     Comments: See photo  Neurological:     General: No focal deficit present.     Mental Status: She is alert and oriented to person, place, and time.  Psychiatric:        Mood and Affect: Mood normal.        Behavior: Behavior normal.        Thought Content: Thought content normal.        Judgment: Judgment normal.     BP 137/84 (BP Location: Left Arm, Patient Position: Sitting, Cuff Size: Normal)   Pulse 84   Temp 98.5 F (36.9 C) (Oral)   Resp 18   Ht _0  (1.651 m)   Wt 144 lb (65.3 kg)   LMP 06/26/2021   SpO2 99%   BMI 23.96 kg/m  Wt Readings from Last 3 Encounters:  07/05/21 144 lb (65.3 kg)  06/21/21 138 lb (62.6 kg)  05/21/21 160 lb 0.9 oz (72.6 kg)     Health Maintenance Due  Topic Date Due   COVID-19 Vaccine (1) Never done   Pneumococcal Vaccine 82-43 Years old (1 - PCV) Never done   PAP SMEAR-Modifier  01/10/2020   INFLUENZA VACCINE  06/07/2021    There are no preventive care reminders to display for this patient.  Lab Results  Component Value Date   TSH 0.888 06/21/2021   Lab Results  Component Value Date   WBC 4.6 03/18/2021   HGB 13.0 03/18/2021   HCT 39.2 03/18/2021   MCV 94.7 03/18/2021   PLT 316 03/18/2021   Lab Results  Component Value Date   NA 141 06/21/2021   K 3.4 (L) 06/21/2021   CO2 21 (L) 03/18/2021   GLUCOSE 104 (H)  06/21/2021   BUN 9 06/21/2021   CREATININE 0.60 06/21/2021   BILITOT 0.3 06/21/2021   ALKPHOS 63 06/21/2021   AST 13 06/21/2021   ALT 28 03/18/2021   PROT 6.3 06/21/2021   ALBUMIN 4.7 06/21/2021   CALCIUM 8.6 (L)  06/21/2021   ANIONGAP 8 03/18/2021   EGFR 119 06/21/2021   No results found for: CHOL No results found for: HDL No results found for: LDLCALC No results found for: TRIG No results found for: CHOLHDL Lab Results  Component Value Date   HGBA1C 4.9 12/09/2016      Assessment & Plan:   Problem List Items Addressed This Visit       Cardiovascular and Mediastinum   Essential hypertension - Primary   Relevant Medications   lisinopril (ZESTRIL) 5 MG tablet     Other   Alcohol abuse   Other Visit Diagnoses     Ringworm of body       Relevant Medications   clotrimazole-betamethasone (LOTRISONE) cream   Vitamin D deficiency           Meds ordered this encounter  Medications   lisinopril (ZESTRIL) 5 MG tablet    Sig: Take 1 tablet (5 mg total) by mouth daily.    Dispense:  30 tablet    Refill:  1    Order Specific Question:   Supervising Provider    Answer:   Elsie Stain [[id number]]   clotrimazole-betamethasone (LOTRISONE) cream    Sig: Apply 1 application topically daily. Until resolved    Dispense:  30 g    Refill:  0    Order Specific Question:   Supervising Provider    Answer:   WRIGHT, PATRICK E [[id number]]  1. Essential hypertension Trial lisinopril.  Patient does understand risk of lisinopril during childbearing ages, patient adamantly denies desire for pregnancy, advised to use birth control.  Patient encouraged to check blood pressure at home, keep a written log and have available for all office visits.  Patient strongly encouraged to follow-up with medical provider at new facility in [place].  Red flags given for prompt  reevaluation.  Encouraged to follow-up with mobile unit in 2 weeks if able - lisinopril (ZESTRIL) 5 MG tablet; Take 1 tablet (5  mg total) by mouth daily.  Dispense: 30 tablet; Refill: 1  2. Ringworm of body Trial Lotrisone, patient education given on supportive care - clotrimazole-betamethasone (LOTRISONE) cream; Apply 1 application topically daily. Until resolved  Dispense: 30 g; Refill: 0  3. Vitamin D deficiency Continue over-the-counter treatment  4. Alcohol abuse Currently in residential treatment center   I have reviewed the patient's medical history (PMH, PSH, Social History, Family History, Medications, and allergies) , and have been updated if relevant. I spent 32 minutes reviewing chart and  face to face time with patient.    Follow-up: Return in about 2 weeks (around 07/19/2021).    Loraine Grip Mayers, PA-C

## 2021-07-05 NOTE — Progress Notes (Signed)
Patient has eaten and taken medication today. Patient noticed 2 weeks ago a spot on the right side itching.

## 2021-07-05 NOTE — Patient Instructions (Addendum)
To help lower your elevated blood pressure readings, you will take lisinopril 5 mg once a day.  I do encourage you to your blood continue on a daily basis, keeping a written log and having available for all office visits.  I do encourage you to follow a low-sodium diet and drink lots of water.  I sent a cream to your pharmacy to help you with your ringworm rash.  Please let us know if there is anything else we can do for you  Roney Jaffe, PA-C Physician Assistant [facility] [email or web address]  Body Ringworm Body ringworm is an infection of the skin that often causes a ring-shaped rash.Body ringworm is also called tinea corporis. Body ringworm can affect any part of your skin. This condition is easily spread from person to person (is very contagious). What are the causes? This condition is caused by fungi called dermatophytes. The condition developswhen these fungi grow out of control on the skin. You can get this condition if you touch a person or animal that has it. You can also get it if you share any items with an infected person or pet. These include: Clothing, bedding, and towels. Brushes or combs. Gym equipment. Any other object that has the fungus on it. What increases the risk? You are more likely to develop this condition if you: Play sports that involve close physical contact, such as wrestling. Sweat a lot. Live in areas that are hot and humid. Use public showers. Have a weakened immune system. What are the signs or symptoms? Symptoms of this condition include: Itchy, raised red spots and bumps. Red scaly patches. A ring-shaped rash. The rash may have: A clear center. Scales or red bumps at its center. Redness near its borders. Dry and scaly skin on or around it. How is this diagnosed? This condition can usually be diagnosed with a skin exam. A skin scraping may be taken from the affected area and examined  under a microscope to see if thefungus is present. How is this treated? This condition may be treated with: An antifungal cream or ointment. An antifungal shampoo. Antifungal medicines. These may be prescribed if your ringworm: Is severe. Keeps coming back. Lasts a long time. Follow these instructions at home: Take over-the-counter and prescription medicines only as told by your health care provider. If you were given an antifungal cream or ointment: Use it as told by your health care provider. Wash the infected area and dry it completely before applying the cream or ointment. If you were given an antifungal shampoo: Use it as told by your health care provider. Leave the shampoo on your body for 3-5 minutes before rinsing. While you have a rash: Wear loose clothing to stop clothes from rubbing and irritating it. Wash or change your bed sheets every night. Disinfect or throw out items that may be infected. Wash clothes and bed sheets in hot water. Wash your hands often with soap and water. If soap and water are not available, use hand sanitizer. If your pet has the same infection, take your pet to see a veterinarian for treatment. How is this prevented? Take a bath or shower every day and after every time you work out or play sports. Dry your skin completely after bathing. Wear sandals or shoes in public places and showers. Change your clothes every day. Wash athletic clothes after each use. Do not share personal items with others. Avoid touching red patches of skin on other people. Avoid touching pets that  have bald spots. If you touch an animal that has a bald spot, wash your hands. Contact a health care provider if: Your rash continues to spread after 7 days of treatment. Your rash is not gone in 4 weeks. The area around your rash gets red, warm, tender, and swollen. Summary Body ringworm is an infection of the skin that often causes a ring-shaped rash. This condition is  easily spread from person to person (is very contagious). This condition may be treated with antifungal cream or ointment, antifungal shampoo, or antifungal medicines. Take over-the-counter and prescription medicines only as told by your health care provider. This information is not intended to replace advice given to you by your health care provider. Make sure you discuss any questions you have with your healthcare provider. Document Revised: 06/22/2018 Document Reviewed: 06/22/2018 Elsevier Patient Education  2022 [employer].

## 2021-07-06 DIAGNOSIS — E559 Vitamin D deficiency, unspecified: Secondary | ICD-10-CM | POA: Insufficient documentation
